# Patient Record
Sex: Male | Born: 1980 | Race: White | Hispanic: No | Marital: Single | Smoking: Never smoker
Health system: Southern US, Community
[De-identification: ages and names within clinical notes are randomized; demographics above are authoritative.]

## PROBLEM LIST (undated history)

## (undated) DIAGNOSIS — J45909 Unspecified asthma, uncomplicated: Secondary | ICD-10-CM

---

## 2013-11-15 ENCOUNTER — Encounter (HOSPITAL_COMMUNITY): Payer: Self-pay | Admitting: Emergency Medicine

## 2013-11-15 ENCOUNTER — Emergency Department (HOSPITAL_COMMUNITY): Payer: Self-pay

## 2013-11-15 ENCOUNTER — Emergency Department (HOSPITAL_COMMUNITY)
Admission: EM | Admit: 2013-11-15 | Discharge: 2013-11-15 | Disposition: A | Discharge status: Home / Self Care | Payer: Self-pay | Attending: Emergency Medicine | Admitting: Emergency Medicine

## 2013-11-15 DIAGNOSIS — S52599A Other fractures of lower end of unspecified radius, initial encounter for closed fracture: Secondary | ICD-10-CM | POA: Insufficient documentation

## 2013-11-15 DIAGNOSIS — Y9301 Activity, walking, marching and hiking: Secondary | ICD-10-CM | POA: Insufficient documentation

## 2013-11-15 DIAGNOSIS — S59919A Unspecified injury of unspecified forearm, initial encounter: Secondary | ICD-10-CM

## 2013-11-15 DIAGNOSIS — S6990XA Unspecified injury of unspecified wrist, hand and finger(s), initial encounter: Secondary | ICD-10-CM

## 2013-11-15 DIAGNOSIS — Y92009 Unspecified place in unspecified non-institutional (private) residence as the place of occurrence of the external cause: Secondary | ICD-10-CM | POA: Insufficient documentation

## 2013-11-15 DIAGNOSIS — S62102A Fracture of unspecified carpal bone, left wrist, initial encounter for closed fracture: Secondary | ICD-10-CM

## 2013-11-15 DIAGNOSIS — J45909 Unspecified asthma, uncomplicated: Secondary | ICD-10-CM | POA: Insufficient documentation

## 2013-11-15 DIAGNOSIS — S59909A Unspecified injury of unspecified elbow, initial encounter: Secondary | ICD-10-CM | POA: Insufficient documentation

## 2013-11-15 DIAGNOSIS — R296 Repeated falls: Secondary | ICD-10-CM | POA: Insufficient documentation

## 2013-11-15 HISTORY — DX: Unspecified asthma, uncomplicated: J45.909

## 2013-11-15 MED ORDER — NAPROXEN 500 MG PO TABS: 500.0000 mg | ORAL_TABLET | Freq: Two times a day (BID) | ORAL | Status: DC

## 2013-11-15 MED ORDER — OXYCODONE-ACETAMINOPHEN 5-325 MG PO TABS
1.0000 | ORAL_TABLET | Freq: Once | ORAL | Status: AC
  Administered 2013-11-15: 1 via ORAL
  Filled 2013-11-15: qty 1

## 2013-11-15 MED ORDER — OXYCODONE-ACETAMINOPHEN 5-325 MG PO TABS: 1.0000 | ORAL_TABLET | ORAL | Status: DC | PRN

## 2013-11-15 NOTE — ED Provider Notes (Signed)
CSN: [id number]     Arrival date & time 11/15/13  1250 History   First MD Initiated Contact with Patient 11/15/13 1307     Chief Complaint  Patient presents with  . Wrist Pain     (Consider location/radiation/quality/duration/timing/severity/associated sxs/prior Treatment) Patient is a 33 y.o. male presenting with wrist pain. The history is provided by the patient.  Wrist Pain This is a new problem. The current episode started today. The problem occurs constantly.   Andrew Contreras is a 33 y.o. male who presents to the ED with left wrist pain. He was walking at home in a wooded area and fell. He landed on his hands. He is having pain and swelling to the left wrist. He denies other injuries. He has taken nothing for pain.   Past Medical History  Diagnosis Date  . Asthma    History reviewed. No pertinent past surgical history. Family History  Problem Relation Age of Onset  . Cancer Father   . Diabetes Father    History  Substance Use Topics  . Smoking status: Never Smoker   . Smokeless tobacco: Not on file  . Alcohol Use: No    Review of Systems  Musculoskeletal:       Left wrist pain and swelling  all other systems negative    Allergies  Review of patient's allergies indicates no known allergies.  Home Medications   Prior to Admission medications   Not on File   BP 140/83  Pulse 86  Temp(Src) 99 F (37.2 C) (Oral)  Resp 18  Ht 6\' 2"  (1.88 m)  Wt 250 lb (113.399 kg)  BMI 32.08 kg/m2  SpO2 98% Physical Exam  Nursing note and vitals reviewed. Constitutional: He is oriented to person, place, and time. He appears well-developed and well-nourished.  HENT:  Head: Normocephalic and atraumatic.  Eyes: EOM are normal.  Neck: Neck supple.  Cardiovascular: Normal rate.   Pulmonary/Chest: Effort normal.  Musculoskeletal:       Left wrist: He exhibits tenderness, swelling and deformity. He exhibits normal range of motion and no laceration.  Radial pulse strong,  adequate circulation, good touch sensation, good strength.   Neurological: He is alert and oriented to person, place, and time. No cranial nerve deficit.  Skin: Skin is warm and dry.  Psychiatric: He has a normal mood and affect. His behavior is normal.    ED Course  Procedures X-ray, volar splint, pain management, ice, elevation  MDM  Dg Wrist Complete Left  11/15/2013   CLINICAL DATA:  [place]arey Seat, LEFT wrist pain and swelling.  EXAM: LEFT WRIST - COMPLETE 3+ VIEW  COMPARISON:  None.  FINDINGS: There is a transverse, slightly comminuted Colles fracture the distal radius with dorsal angulation. Ulnar styloid grossly intact. No carpal bone fracture. Mild soft tissue swelling.  IMPRESSION: Colles fracture distal radius.   Electronically Signed   By: Davonna Belling M.D.   On: 11/15/2013 13:36    33 y.o. male with left wrist pain and swelling s/p fall outside at his home today. Stable for discharge and remains neurovascularly intact. Discussed with the patient neurovascular checks. He will return for any problems. I have reviewed this patient's vital signs, nurses notes, appropriate labs and imaging.  I have discussed findings with the patient and plan of care. He voices understanding and agrees with plan. He will call Dr. Mort Sawyers office to schedule a follow up appointment.    Medication List         naproxen 500  MG tablet  Commonly known as:  NAPROSYN  Take 1 tablet (500 mg total) by mouth 2 (two) times daily.     oxyCODONE-acetaminophen 5-325 MG per tablet  Commonly known as:  ROXICET  Take 1 tablet by mouth every 4 (four) hours as needed.           [address]Hope ChesapeakeM Neese, [place] 11/16/13 [phone]

## 2013-11-15 NOTE — ED Notes (Signed)
Fall at home in wooded area.  Injury to left wrist.  Pain rated 3.

## 2013-11-15 NOTE — Discharge Instructions (Signed)
Call Dr. Mort SawyersHarrison's office for follow up. Return here as needed for any problems.

## 2013-11-16 NOTE — ED Provider Notes (Signed)
Medical screening examination/treatment/procedure(s) were performed by non-physician practitioner and as supervising physician I was immediately available for consultation/collaboration.   EKG Interpretation None        Abdirizak Richison W Shanik [place], MD 11/16/13 1019 

## 2013-11-18 ENCOUNTER — Encounter: Payer: Self-pay | Admitting: Orthopedic Surgery

## 2013-11-18 ENCOUNTER — Ambulatory Visit (INDEPENDENT_AMBULATORY_CARE_PROVIDER_SITE_OTHER): Payer: Self-pay | Admitting: Orthopedic Surgery

## 2013-11-18 VITALS — BP 138/81 | Ht 74.0 in | Wt 250.0 lb

## 2013-11-18 DIAGNOSIS — S52532A Colles' fracture of left radius, initial encounter for closed fracture: Secondary | ICD-10-CM

## 2013-11-18 DIAGNOSIS — S52539A Colles' fracture of unspecified radius, initial encounter for closed fracture: Secondary | ICD-10-CM

## 2013-11-18 MED ORDER — HYDROCODONE-ACETAMINOPHEN 7.5-325 MG PO TABS: 1.0000 | ORAL_TABLET | ORAL | Status: DC | PRN

## 2013-11-18 NOTE — Patient Instructions (Signed)
Work note- Okay to return to work in [employer]rap[employer], [employer]

## 2013-11-18 NOTE — Progress Notes (Signed)
Chief Complaint  Patient presents with  . Wrist Injury    Left wrist fracture,  DOI 11/15/13    HISTORY: 33 year old male right-hand-dominant injured on 11/15/2013 felt complains of left gall wrist pain which is currently 2/10. He got a Percocet in the ER didn't take it takes naproxen 500  Review of systems has been recorded reviewed and signed and scanned into the chart  The past, family history and social history have been reviewed and are recorded in the corresponding sections of epic    Vital signs are stable as recorded  General appearance is normal, body habitus large  The patient is alert and oriented x 3  The patient's mood and affect are normal  Gait assessment: Normal  The cardiovascular exam reveals normal pulses and temperature without edema or  swelling.  The lymphatic system is negative for palpable lymph nodes  The sensory exam is normal.  There are no pathologic reflexes.  Balance is normal.   Exam of the left wrist  Inspection tender and swollen distal radius Range of motion Limited range of motion Stability could not assess stability x-ray shows stable joint Strength muscle tone normal Skin normal, no rash, or laceration. Provocative tests and unnecessary  A/P X-rays show a nondisplaced slightly dorsally angulated apex volar distal radius fracture with normal radial inclination  Distal radius fracture left wrist closed initial treatment  Short arm cast for 4 weeks x-ray in cast in 4 weeks  Meds ordered this encounter  Medications  . HYDROcodone-acetaminophen (NORCO) 7.5-325 MG per tablet    Sig: Take 1 tablet by mouth every 4 (four) hours as needed for moderate pain.    Dispense:  84 tablet    Refill:  0

## 2013-12-17 ENCOUNTER — Encounter: Payer: Self-pay | Admitting: Orthopedic Surgery

## 2013-12-17 ENCOUNTER — Ambulatory Visit (INDEPENDENT_AMBULATORY_CARE_PROVIDER_SITE_OTHER): Payer: MEDICARE

## 2013-12-17 ENCOUNTER — Ambulatory Visit (INDEPENDENT_AMBULATORY_CARE_PROVIDER_SITE_OTHER): Payer: MEDICARE | Admitting: Orthopedic Surgery

## 2013-12-17 VITALS — BP 121/65 | Ht 74.0 in | Wt 250.0 lb

## 2013-12-17 DIAGNOSIS — IMO0001 Reserved for inherently not codable concepts without codable children: Secondary | ICD-10-CM

## 2013-12-17 DIAGNOSIS — S62102D Fracture of unspecified carpal bone, left wrist, subsequent encounter for fracture with routine healing: Secondary | ICD-10-CM

## 2013-12-17 NOTE — Progress Notes (Signed)
Chief Complaint  Patient presents with  . Follow-up    follow up left wrist fx with xray, DOI 11/15/13   BP 121/65  Ht  (1.88 m)  Wt 250 lb (113.399 kg)  BMI 32.08 kg/m2   HISTORY: 33 year old male right-hand-dominant injured on 11/15/2013  Short arm cast;  x-ray in cast in 4 weeks    Meds ordered this encounter   Medications   .  HYDROcodone-acetaminophen (NORCO) 7.5-325 MG per tablet       Sig: Take 1 tablet by mouth every 4 (four) hours as needed for moderate pain.       Dispense:  84 tablet       Refill:  0    Wrist Injury        Left wrist fracture,  DOI 11/15/13    X-ray show stable fracture  Patient placed in heat multiple fracture brace come back 4 weeks x-ray again

## 2013-12-17 NOTE — Patient Instructions (Signed)
WEAR BRACE AT ALL TIMES EXCEPT FOR SHOWERING

## 2014-01-14 ENCOUNTER — Ambulatory Visit (INDEPENDENT_AMBULATORY_CARE_PROVIDER_SITE_OTHER): Payer: MEDICARE

## 2014-01-14 ENCOUNTER — Ambulatory Visit (INDEPENDENT_AMBULATORY_CARE_PROVIDER_SITE_OTHER): Payer: Self-pay | Admitting: Orthopedic Surgery

## 2014-01-14 ENCOUNTER — Encounter: Payer: Self-pay | Admitting: Orthopedic Surgery

## 2014-01-14 VITALS — BP 128/58 | Ht 74.0 in | Wt 250.0 lb

## 2014-01-14 DIAGNOSIS — S62102D Fracture of unspecified carpal bone, left wrist, subsequent encounter for fracture with routine healing: Secondary | ICD-10-CM

## 2014-01-14 NOTE — Progress Notes (Signed)
Chief Complaint  Patient presents with  . Follow-up    4 week recheck on left wrist fracture with xray. DOI 11-15-13.    Approximately 8 weeks status post left distal radius fracture treated with cast followed by hard brace doing well without symptoms. X-rays show fracture healing with slight apex volar angulation. He has normal range of motion in his wrist except for supination which is about 80% of normal. No tenderness at his fracture site is neurovascularly intact  He is released from care.

## 2014-12-24 ENCOUNTER — Ambulatory Visit (INDEPENDENT_AMBULATORY_CARE_PROVIDER_SITE_OTHER): Payer: Commercial Managed Care - HMO | Admitting: Family Medicine

## 2014-12-24 ENCOUNTER — Encounter: Payer: Self-pay | Admitting: Family Medicine

## 2014-12-24 VITALS — BP 114/71 | HR 68 | Temp 98.2°F | Ht 71.0 in | Wt 243.6 lb

## 2014-12-24 DIAGNOSIS — Z0000 Encounter for general adult medical examination without abnormal findings: Secondary | ICD-10-CM | POA: Diagnosis not present

## 2014-12-24 LAB — POCT UA - MICROSCOPIC ONLY
BACTERIA, U MICROSCOPIC: NEGATIVE
CRYSTALS, UR, HPF, POC: NEGATIVE
Casts, Ur, LPF, POC: NEGATIVE
Mucus, UA: NEGATIVE
RBC, URINE, MICROSCOPIC: NEGATIVE
WBC, Ur, HPF, POC: NEGATIVE
Yeast, UA: NEGATIVE

## 2014-12-24 LAB — POCT URINALYSIS DIPSTICK
BILIRUBIN UA: NEGATIVE
Blood, UA: NEGATIVE
GLUCOSE UA: NEGATIVE
Ketones, UA: NEGATIVE
LEUKOCYTES UA: NEGATIVE
NITRITE UA: NEGATIVE
PH UA: 8
Protein, UA: NEGATIVE
Spec Grav, UA: 1.01
Urobilinogen, UA: NEGATIVE

## 2014-12-24 NOTE — Patient Instructions (Signed)
Exercise to Lose Weight Exercise and a healthy diet may help you lose weight. Your doctor may suggest specific exercises. EXERCISE IDEAS AND TIPS  Choose low-cost things you enjoy doing, such as walking, bicycling, or exercising to workout videos.  Take stairs instead of the elevator.  Walk during your lunch break.  Park your car further away from work or school.  Go to a gym or an exercise class.  Start with 5 to 10 minutes of exercise each day. Build up to 30 minutes of exercise 4 to 6 days a week.  Wear shoes with good support and comfortable clothes.  Stretch before and after working out.  Work out until you breathe harder and your heart beats faster.  Drink extra water when you exercise.  Do not do so much that you hurt yourself, feel dizzy, or get very short of breath. Exercises that burn about 150 calories:  Running 1  miles in 15 minutes.  Playing volleyball for 45 to 60 minutes.  Washing and waxing a car for 45 to 60 minutes.  Playing touch football for 45 minutes.  Walking 1  miles in 35 minutes.  Pushing a stroller 1  miles in 30 minutes.  Playing basketball for 30 minutes.  Raking leaves for 30 minutes.  Bicycling 5 miles in 30 minutes.  Walking 2 miles in 30 minutes.  Dancing for 30 minutes.  Shoveling snow for 15 minutes.  Swimming laps for 20 minutes.  Walking up stairs for 15 minutes.  Bicycling 4 miles in 15 minutes.  Gardening for 30 to 45 minutes.  Jumping rope for 15 minutes.  Washing windows or floors for 45 to 60 minutes. Document Released: 04/30/2010 Document Revised: 06/20/2011 Document Reviewed: 04/30/2010 Exit[employer] Patient Information 2015 [employer]. This information is not intended to replace advice given to you by your health care provider. Make sure you discuss any questions you have with your health care provider.  

## 2014-12-24 NOTE — Progress Notes (Signed)
Subjective:  Patient ID: Andrew Contreras, male    DOB: Feb 09, 1981  Age: 34 y.o. MRN: [id number]  CC: Annual Exam   HPI Andrew Contreras presents for CPE  History Andrew Contreras has a past medical history of Asthma.   He has no past surgical history on file.   His family history includes Cancer in his father; Diabetes in his father.He reports that he has never smoked. He does not have any smokeless tobacco history on file. He reports that he does not drink alcohol or use illicit drugs.  Outpatient Prescriptions Prior to Visit  Medication Sig Dispense Refill  . HYDROcodone-acetaminophen (NORCO) 7.5-325 MG per tablet Take 1 tablet by mouth every 4 (four) hours as needed for moderate pain. (Patient not taking: Reported on 12/24/2014) 84 tablet 0  . naproxen (NAPROSYN) 500 MG tablet Take 1 tablet (500 mg total) by mouth 2 (two) times daily. (Patient not taking: Reported on 12/24/2014) 20 tablet 0  . oxyCODONE-acetaminophen (ROXICET) 5-325 MG per tablet Take 1 tablet by mouth every 4 (four) hours as needed. (Patient not taking: Reported on 12/24/2014) 20 tablet 0   No facility-administered medications prior to visit.    ROS Review of Systems  Constitutional: Negative for fever, chills, diaphoresis, activity change, appetite change, fatigue and unexpected weight change.  HENT: Negative for congestion, ear pain, hearing loss, postnasal drip, rhinorrhea, sore throat, tinnitus and trouble swallowing.   Eyes: Negative for photophobia, pain, discharge and redness.  Respiratory: Negative for apnea, cough, choking, chest tightness, shortness of breath, wheezing and stridor.   Cardiovascular: Negative for chest pain, palpitations and leg swelling.  Gastrointestinal: Negative for nausea, vomiting, abdominal pain, diarrhea, constipation, blood in stool and abdominal distention.  Endocrine: Negative for cold intolerance, heat intolerance, polydipsia, polyphagia and polyuria.  Genitourinary: Negative for  dysuria, urgency, frequency, hematuria, flank pain, enuresis, difficulty urinating and genital sores.  Musculoskeletal: Negative for joint swelling and arthralgias.  Skin: Negative for color change, rash and wound.  Allergic/Immunologic: Negative for immunocompromised state.  Neurological: Negative for dizziness, tremors, seizures, syncope, facial asymmetry, speech difficulty, weakness, light-headedness, numbness and headaches.  Hematological: Does not bruise/bleed easily.  Psychiatric/Behavioral: Negative for suicidal ideas, hallucinations, behavioral problems, confusion, sleep disturbance, dysphoric mood, decreased concentration and agitation. The patient is not nervous/anxious and is not hyperactive.     Objective:  BP 114/71 mmHg  Pulse 68  Temp(Src) 98.2 F (36.8 C) (Oral)  Ht 5' 11"  (1.803 m)  Wt 243 lb 9.6 oz (110.496 kg)  BMI 33.99 kg/m2  BP Readings from Last 3 Encounters:  12/24/14 114/71  01/14/14 128/58  12/17/13 121/65    Wt Readings from Last 3 Encounters:  12/24/14 243 lb 9.6 oz (110.496 kg)  01/14/14 250 lb (113.399 kg)  12/17/13 250 lb (113.399 kg)     Physical Exam  Constitutional: He is oriented to person, place, and time. He appears well-developed and well-nourished.  HENT:  Head: Normocephalic and atraumatic.  Mouth/Throat: Oropharynx is clear and moist.  Eyes: EOM are normal. Pupils are equal, round, and reactive to light.  Neck: Normal range of motion. No tracheal deviation present. No thyromegaly present.  Cardiovascular: Normal rate, regular rhythm and normal heart sounds.  Exam reveals no gallop and no friction rub.   No murmur heard. Pulmonary/Chest: Breath sounds normal. He has no wheezes. He has no rales.  Abdominal: Soft. He exhibits no mass. There is no tenderness.  Musculoskeletal: Normal range of motion. He exhibits no edema.  Neurological: He is  alert and oriented to person, place, and time.  Skin: Skin is warm and dry.  Psychiatric: He  has a normal mood and affect.    No results found for: HGBA1C  No results found for: WBC, HGB, HCT, PLT, GLUCOSE, CHOL, TRIG, HDL, LDLDIRECT, LDLCALC, ALT, AST, NA, K, CL, CREATININE, BUN, CO2, TSH, PSA, INR, GLUF, HGBA1C, MICROALBUR  Dg Wrist Complete Left  11/15/2013   CLINICAL DATA:  [place], LEFT wrist pain and swelling.  EXAM: LEFT WRIST - COMPLETE 3+ VIEW  COMPARISON:  None.  FINDINGS: There is a transverse, slightly comminuted Colles fracture the distal radius with dorsal angulation. Ulnar styloid grossly intact. No carpal bone fracture. Mild soft tissue swelling.  IMPRESSION: Colles fracture distal radius.   Electronically Signed   By: Rolla Flatten M.D.   On: 11/15/2013 13:36    Assessment & Plan:   Martha was seen today for annual exam.  Diagnoses and all orders for this visit:  Wellness examination -     POCT urinalysis dipstick -     POCT UA - Microscopic Only -     CMP14+EGFR -     CBC with Differential/Platelet -     Lipid panel -     TSH   I have discontinued Mr. Wolgamott oxyCODONE-acetaminophen, naproxen, and HYDROcodone-acetaminophen.  No orders of the defined types were placed in this encounter.     Follow-up: Return in about 1 year (around 12/24/2015) for CPE.  Claretta Fraise, M.D.

## 2014-12-25 LAB — CMP14+EGFR
A/G RATIO: 2.1 (ref 1.1–2.5)
ALT: 38 IU/L (ref 0–44)
AST: 23 IU/L (ref 0–40)
Albumin: 5 g/dL (ref 3.5–5.5)
Alkaline Phosphatase: 49 IU/L (ref 39–117)
BILIRUBIN TOTAL: 0.9 mg/dL (ref 0.0–1.2)
BUN/Creatinine Ratio: 14 (ref 8–19)
BUN: 12 mg/dL (ref 6–20)
CHLORIDE: 99 mmol/L (ref 97–108)
CO2: 26 mmol/L (ref 18–29)
Calcium: 9.7 mg/dL (ref 8.7–10.2)
Creatinine, Ser: 0.83 mg/dL (ref 0.76–1.27)
GFR calc Af Amer: 136 mL/min/{1.73_m2} (ref >59)
GFR calc non Af Amer: 117 mL/min/{1.73_m2} (ref >59)
Globulin, Total: 2.4 g/dL (ref 1.5–4.5)
Glucose: 99 mg/dL (ref 65–99)
POTASSIUM: 5.2 mmol/L (ref 3.5–5.2)
Sodium: 142 mmol/L (ref 134–144)
Total Protein: 7.4 g/dL (ref 6.0–8.5)

## 2014-12-25 LAB — TSH: TSH: 0.744 u[IU]/mL (ref 0.450–4.500)

## 2014-12-25 LAB — CBC WITH DIFFERENTIAL/PLATELET
BASOS ABS: 0 10*3/uL (ref 0.0–0.2)
Basos: 0 %
EOS (ABSOLUTE): 0.1 10*3/uL (ref 0.0–0.4)
Eos: 2 %
Hematocrit: 48.3 % (ref 37.5–51.0)
Hemoglobin: 16.5 g/dL (ref 12.6–17.7)
IMMATURE GRANS (ABS): 0 10*3/uL (ref 0.0–0.1)
IMMATURE GRANULOCYTES: 0 %
Lymphocytes Absolute: 1.9 10*3/uL (ref 0.7–3.1)
Lymphs: 27 %
MCH: 30.6 pg (ref 26.6–33.0)
MCHC: 34.2 g/dL (ref 31.5–35.7)
MCV: 90 fL (ref 79–97)
MONOS ABS: 0.6 10*3/uL (ref 0.1–0.9)
Monocytes: 8 %
NEUTROS ABS: 4.3 10*3/uL (ref 1.4–7.0)
NEUTROS PCT: 63 %
Platelets: 201 10*3/uL (ref 150–379)
RBC: 5.39 x10E6/uL (ref 4.14–5.80)
RDW: 13.3 % (ref 12.3–15.4)
WBC: 6.9 10*3/uL (ref 3.4–10.8)

## 2014-12-25 LAB — LIPID PANEL
CHOLESTEROL TOTAL: 212 mg/dL — AB (ref 100–199)
Chol/HDL Ratio: 5.6 ratio units — ABNORMAL HIGH (ref 0.0–5.0)
HDL: 38 mg/dL — ABNORMAL LOW (ref >39)
LDL Calculated: 125 mg/dL — ABNORMAL HIGH (ref 0–99)
Triglycerides: 246 mg/dL — ABNORMAL HIGH (ref 0–149)
VLDL CHOLESTEROL CAL: 49 mg/dL — AB (ref 5–40)

## 2014-12-29 ENCOUNTER — Telehealth: Payer: Self-pay | Admitting: Family Medicine

## 2014-12-29 NOTE — Progress Notes (Signed)
PT. Mother aware

## 2014-12-29 NOTE — Telephone Encounter (Signed)
He doesn't need to see a provider but he does need to check in at the front desk for a lab appointment.  Mother notified.

## 2015-03-19 ENCOUNTER — Ambulatory Visit (INDEPENDENT_AMBULATORY_CARE_PROVIDER_SITE_OTHER): Payer: Commercial Managed Care - HMO | Admitting: Family Medicine

## 2015-03-19 ENCOUNTER — Encounter: Payer: Self-pay | Admitting: Family Medicine

## 2015-03-19 VITALS — BP 132/81 | HR 79 | Temp 98.8°F | Ht 71.0 in | Wt 235.2 lb

## 2015-03-19 DIAGNOSIS — J020 Streptococcal pharyngitis: Secondary | ICD-10-CM

## 2015-03-19 DIAGNOSIS — J029 Acute pharyngitis, unspecified: Secondary | ICD-10-CM | POA: Diagnosis not present

## 2015-03-19 LAB — POCT RAPID STREP A (OFFICE): Rapid Strep A Screen: POSITIVE — AB

## 2015-03-19 MED ORDER — AMOXICILLIN 500 MG PO CAPS: 500.0000 mg | ORAL_CAPSULE | Freq: Two times a day (BID) | ORAL | Status: DC

## 2015-03-19 NOTE — Progress Notes (Signed)
   HPI  Patient presents today with sore throat  He reports 3 days of sore throat and tender lymph nodes bilaterally in his neck. He has no dyspnea, cough, facial pain, oral intolerance, or headaches.   He has no sick contacts.   He is eating easily but avoiding crunchy or hard foods. He is drinking fluids easily   PMH: Smoking status noted ROS: Per HPI  Objective: BP 132/81 mmHg  Pulse 79  Temp(Src) 98.8 F (37.1 C) (Oral)  Ht 5\' 11"  (1.803 m)  Wt 235 lb 3.2 oz (106.686 kg)  BMI 32.82 kg/m2 Gen: NAD, alert, cooperative with exam HEENT: NCAT, erythema of the oropharynx, swollen tonsils and white exudates Neck: tender cervical LNs BL CV: RRR, good S1/S2, no murmur Resp: CTABL, no wheezes, non-labored Ext: No edema, warm Neuro: Alert and oriented, No gross deficits  Assessment and plan:  # Strep throat Tx with amoxicillin Chlorasceptic spray, tylenol, ibuprofen, discussed supportive care.  F/u as needed, RTC if worsening or fails to improve.  Orders Placed This Encounter  Procedures  . Culture, Group A Strep  . POCT rapid strep A    Meds ordered this encounter  Medications  . amoxicillin (AMOXIL) 500 MG capsule    Sig: Take 1 capsule (500 mg total) by mouth 2 (two) times daily.    Dispense:  20 capsule    Refill:  0    Murtis SinkSam Neils [place], MD [facility] 03/19/2015, 12:44 PM

## 2015-03-19 NOTE — Patient Instructions (Signed)
Great to meet you!  Be sure to finish all of the antibiotics.   Strep Throat Strep throat is a bacterial infection of the throat. Your health care provider may call the infection tonsillitis or pharyngitis, depending on whether there is swelling in the tonsils or at the back of the throat. Strep throat is most common during the cold months of the year in children who are 365-32 years of age, but it can happen during any season in people of any age. This infection is spread from person to person (contagious) through coughing, sneezing, or close contact. CAUSES Strep throat is caused by the bacteria called Streptococcus pyogenes. RISK FACTORS This condition is more likely to develop in:  People who spend time in crowded places where the infection can spread easily.  People who have close contact with someone who has strep throat. SYMPTOMS Symptoms of this condition include:  Fever or chills.   Redness, swelling, or pain in the tonsils or throat.  Pain or difficulty when swallowing.  White or yellow spots on the tonsils or throat.  Swollen, tender glands in the neck or under the jaw.  Red rash all over the body (rare). DIAGNOSIS This condition is diagnosed by performing a rapid strep test or by taking a swab of your throat (throat culture test). Results from a rapid strep test are usually ready in a few minutes, but throat culture test results are available after one or two days. TREATMENT This condition is treated with antibiotic medicine. HOME CARE INSTRUCTIONS Medicines  Take over-the-counter and prescription medicines only as told by your health care provider.  Take your antibiotic as told by your health care provider. Do not stop taking the antibiotic even if you start to feel better.  Have family members who also have a sore throat or fever tested for strep throat. They may need antibiotics if they have the strep infection. Eating and Drinking  Do not share food, drinking  cups, or personal items that could cause the infection to spread to other people.  If swallowing is difficult, try eating soft foods until your sore throat feels better.  Drink enough fluid to keep your urine clear or pale yellow. General Instructions  Gargle with a salt-water mixture 3-4 times per day or as needed. To make a salt-water mixture, completely dissolve -1 tsp of salt in 1 cup of warm water.  Make sure that all household members wash their hands well.  Get plenty of rest.  Stay home from school or work until you have been taking antibiotics for 24 hours.  Keep all follow-up visits as told by your health care provider. This is important. SEEK MEDICAL CARE IF:  The glands in your neck continue to get bigger.  You develop a rash, cough, or earache.  You cough up a thick liquid that is green, yellow-brown, or bloody.  You have pain or discomfort that does not get better with medicine.  Your problems seem to be getting worse rather than better.  You have a fever. SEEK IMMEDIATE MEDICAL CARE IF:  You have new symptoms, such as vomiting, severe headache, stiff or painful neck, chest pain, or shortness of breath.  You have severe throat pain, drooling, or changes in your voice.  You have swelling of the neck, or the skin on the neck becomes red and tender.  You have signs of dehydration, such as fatigue, dry mouth, and decreased urination.  You become increasingly sleepy, or you cannot wake up completely.  Your joints become red or painful.   This information is not intended to replace advice given to you by your health care provider. Make sure you discuss any questions you have with your health care provider.   Document Released: 03/25/2000 Document Revised: 12/17/2014 Document Reviewed: 07/21/2014 Elsevier Interactive Patient Education [employer].

## 2015-03-22 LAB — CULTURE, GROUP A STREP

## 2015-10-23 IMAGING — CR DG WRIST COMPLETE 3+V*L*
2 series · 2 of 2 positions shown · non-contrast
Comparison: None.

CLINICAL DATA: Fell, LEFT wrist pain and swelling.

EXAM:
LEFT WRIST - COMPLETE 3+ VIEW

[view not recorded (1 of 2)]
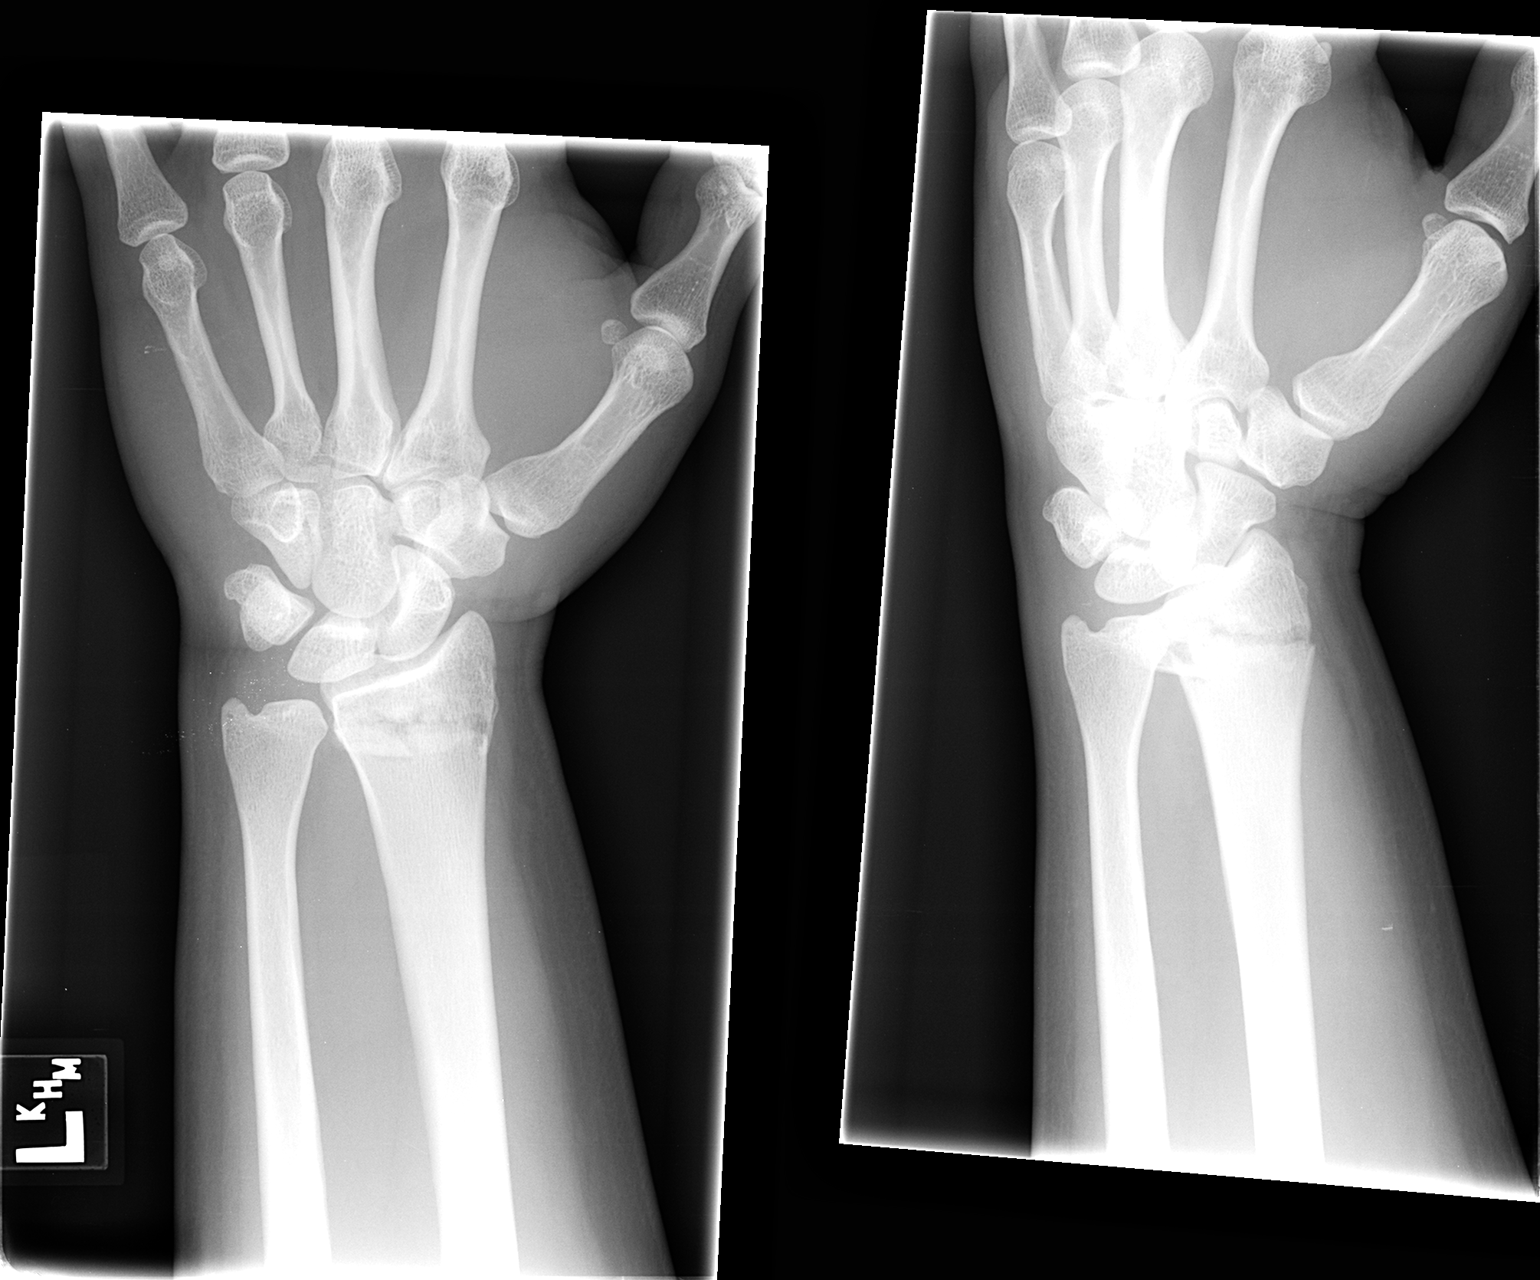

[view not recorded (2 of 2)]
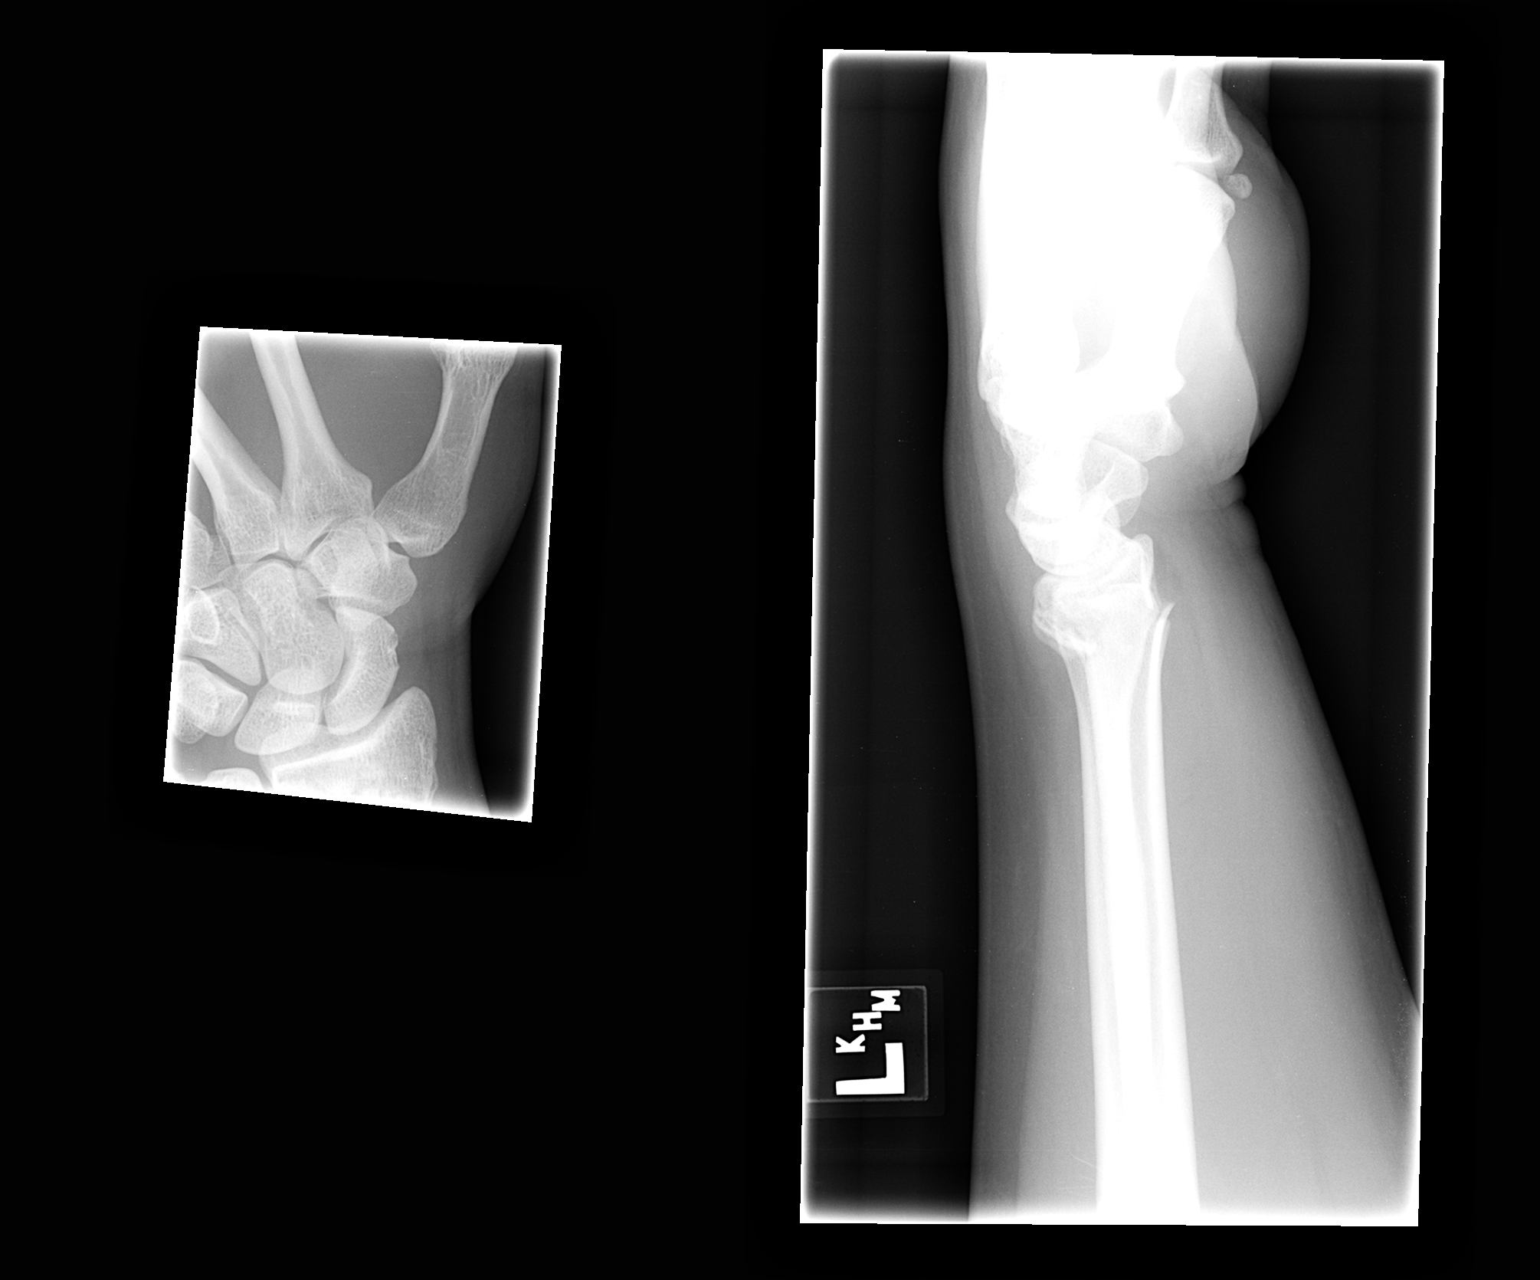

[2 of 2 positions shown; findings below may reference images not displayed]

FINDINGS: There is a transverse, slightly comminuted Colles fracture the
distal radius with dorsal angulation. Ulnar styloid grossly intact.
No carpal bone fracture. Mild soft tissue swelling.
IMPRESSION: Colles fracture distal radius.

## 2018-05-10 ENCOUNTER — Encounter: Payer: Self-pay | Admitting: Physician Assistant

## 2018-05-10 ENCOUNTER — Ambulatory Visit: Payer: Commercial Managed Care - HMO | Admitting: Physician Assistant

## 2018-05-10 VITALS — BP 117/74 | HR 100 | Temp 103.0°F | Ht 71.0 in | Wt 255.2 lb

## 2018-05-10 DIAGNOSIS — J101 Influenza due to other identified influenza virus with other respiratory manifestations: Secondary | ICD-10-CM | POA: Diagnosis not present

## 2018-05-10 DIAGNOSIS — R509 Fever, unspecified: Secondary | ICD-10-CM | POA: Diagnosis not present

## 2018-05-10 LAB — VERITOR FLU A/B WAIVED
INFLUENZA A: POSITIVE — AB
Influenza B: NEGATIVE

## 2018-05-10 MED ORDER — OSELTAMIVIR PHOSPHATE 75 MG PO CAPS: 75.0000 mg | ORAL_CAPSULE | Freq: Two times a day (BID) | ORAL | 0 refills | Status: DC

## 2018-05-10 NOTE — Progress Notes (Signed)
BP 117/74   Pulse 100   Temp (!) 103 F (39.4 C) (Oral)   Ht 5\' 11"  (1.803 m)   Wt 255 lb 3.2 oz (115.8 kg)   BMI 35.59 kg/m    Subjective:    Patient ID: Andrew Contreras, male    DOB: 09-11-1980, 38 y.o.   MRN: [id number]  HPI: Andrew Contreras is a 38 y.o. male presenting on 05/10/2018 for Fever; Generalized Body Aches; and New Patient (Initial Visit)    Past Medical History:  Diagnosis Date  . Asthma    Relevant past medical, surgical, family and social history reviewed and updated as indicated. Interim medical history since our last visit reviewed. Allergies and medications reviewed and updated. DATA REVIEWED: CHART IN EPIC  Family History reviewed for pertinent findings.  Review of Systems  Constitutional: Positive for activity change, fatigue and fever. Negative for appetite change.  HENT: Positive for congestion and sore throat. Negative for sinus pressure.   Eyes: Negative.  Negative for pain and visual disturbance.  Respiratory: Negative for cough, chest tightness, shortness of breath and wheezing.   Cardiovascular: Negative.  Negative for chest pain, palpitations and leg swelling.  Gastrointestinal: Positive for nausea. Negative for abdominal pain, diarrhea and vomiting.  Endocrine: Negative.   Genitourinary: Negative.   Musculoskeletal: Positive for back pain and myalgias. Negative for arthralgias.  Skin: Negative.  Negative for color change and rash.  Neurological: Positive for headaches. Negative for weakness and numbness.  Psychiatric/Behavioral: Negative.     Allergies as of 05/10/2018   No Known Allergies     Medication List       Accurate as of May 10, 2018 12:00 PM. Always use your most recent med list.        oseltamivir 75 MG capsule Commonly known as:  TAMIFLU Take 1 capsule (75 mg total) by mouth 2 (two) times daily.          Objective:    BP 117/74   Pulse 100   Temp (!) 103 F (39.4 C) (Oral)   Ht 5\' 11"  (1.803 m)   Wt  255 lb 3.2 oz (115.8 kg)   BMI 35.59 kg/m   No Known Allergies  Wt Readings from Last 3 Encounters:  05/10/18 255 lb 3.2 oz (115.8 kg)  03/19/15 235 lb 3.2 oz (106.7 kg)  12/24/14 243 lb 9.6 oz (110.5 kg)    Physical Exam Vitals signs and nursing note reviewed.  Constitutional:      General: He is in acute distress.     Appearance: He is well-developed.  HENT:     Head: Normocephalic and atraumatic.     Right Ear: Tympanic membrane normal. No drainage. No middle ear effusion.     Left Ear: Tympanic membrane normal. No drainage.  No middle ear effusion.     Nose: Mucosal edema and rhinorrhea present.     Right Sinus: No maxillary sinus tenderness.     Left Sinus: No maxillary sinus tenderness.     Mouth/Throat:     Pharynx: Uvula midline. Posterior oropharyngeal erythema present. No oropharyngeal exudate.  Eyes:     General:        Right eye: No discharge.        Left eye: No discharge.     Conjunctiva/sclera: Conjunctivae normal.     Pupils: Pupils are equal, round, and reactive to light.  Neck:     Musculoskeletal: Normal range of motion.  Cardiovascular:  Rate and Rhythm: Normal rate and regular rhythm.     Heart sounds: Normal heart sounds.  Pulmonary:     Effort: Pulmonary effort is normal. No respiratory distress.     Breath sounds: Normal breath sounds. No wheezing.  Abdominal:     Palpations: Abdomen is soft.  Lymphadenopathy:     Cervical: No cervical adenopathy.  Skin:    General: Skin is warm and dry.  Neurological:     Mental Status: He is alert and oriented to person, place, and time.  Psychiatric:        Behavior: Behavior normal.     Results for orders placed or performed in visit on 03/19/15  Culture, Group A Strep  Result Value Ref Range   Strep A Culture Comment (A)   POCT rapid strep A  Result Value Ref Range   Rapid Strep A Screen Positive (A) Negative      Assessment & Plan:   1. Fever, unspecified fever cause - Veritor Flu A/B  Waived  2. Influenza A - oseltamivir (TAMIFLU) 75 MG capsule; Take 1 capsule (75 mg total) by mouth 2 (two) times daily.  Dispense: 10 capsule; Refill: 0   Continue all other maintenance medications as listed above.  Follow up plan: No follow-ups on file.  Educational handout given for survey  Remus Loffler PA-C [facility] [address]  [place], [place] [address] [phone]   05/10/2018, 12:00 PM

## 2019-02-06 ENCOUNTER — Other Ambulatory Visit: Payer: Self-pay

## 2019-02-06 ENCOUNTER — Encounter: Payer: Self-pay | Admitting: Family Medicine

## 2019-02-06 ENCOUNTER — Ambulatory Visit (INDEPENDENT_AMBULATORY_CARE_PROVIDER_SITE_OTHER): Payer: Managed Care, Other (non HMO) | Admitting: Family Medicine

## 2019-02-06 VITALS — BP 130/80 | HR 68 | Temp 98.2°F | Resp 20 | Ht 71.0 in | Wt 241.0 lb

## 2019-02-06 DIAGNOSIS — Z6833 Body mass index (BMI) 33.0-33.9, adult: Secondary | ICD-10-CM

## 2019-02-06 DIAGNOSIS — Z0000 Encounter for general adult medical examination without abnormal findings: Secondary | ICD-10-CM

## 2019-02-06 DIAGNOSIS — E669 Obesity, unspecified: Secondary | ICD-10-CM | POA: Diagnosis not present

## 2019-02-06 DIAGNOSIS — Z0001 Encounter for general adult medical examination with abnormal findings: Secondary | ICD-10-CM

## 2019-02-06 DIAGNOSIS — Z23 Encounter for immunization: Secondary | ICD-10-CM | POA: Diagnosis not present

## 2019-02-06 DIAGNOSIS — E66811 Obesity, class 1: Secondary | ICD-10-CM

## 2019-02-06 LAB — URINALYSIS
Bilirubin, UA: NEGATIVE
Glucose, UA: NEGATIVE
Ketones, UA: NEGATIVE
Leukocytes,UA: NEGATIVE
Nitrite, UA: NEGATIVE
Protein,UA: NEGATIVE
Specific Gravity, UA: 1.01 (ref 1.005–1.030)
Urobilinogen, Ur: 0.2 mg/dL (ref 0.2–1.0)
pH, UA: 7 (ref 5.0–7.5)

## 2019-02-06 NOTE — Progress Notes (Signed)
Subjective:  Patient ID: Andrew Contreras, male    DOB: Aug 19, 1980  Age: 38 y.o. MRN: [id number]  CC: Annual Exam   HPI Andrew Contreras presents for CPE. Last was several years ago. Was told his cholesterol was high  Depression screen [facility] 2/9 02/06/2019 05/10/2018 12/24/2014  Decreased Interest 0 0 0  Down, Depressed, Hopeless 0 0 0  PHQ - 2 Score 0 0 0    History Andrew Contreras has a past medical history of Asthma.   He has no past surgical history on file.   His family history includes Cancer in his father; Diabetes in his father.He reports that he has never smoked. He has never used smokeless tobacco. He reports that he does not drink alcohol or use drugs.    ROS Review of Systems  Constitutional: Negative for activity change, fatigue and unexpected weight change.  HENT: Negative for congestion, ear pain, hearing loss, postnasal drip and trouble swallowing.   Eyes: Negative for pain and visual disturbance.  Respiratory: Negative for cough, chest tightness and shortness of breath.   Cardiovascular: Negative for chest pain, palpitations and leg swelling.  Gastrointestinal: Negative for abdominal distention, abdominal pain, blood in stool, constipation, diarrhea, nausea and vomiting.  Endocrine: Negative for cold intolerance, heat intolerance and polydipsia.  Genitourinary: Negative for difficulty urinating, dysuria, flank pain, frequency and urgency.  Musculoskeletal: Negative for arthralgias and joint swelling.  Skin: Negative for color change, rash and wound.  Neurological: Negative for dizziness, syncope, speech difficulty, weakness, light-headedness, numbness and headaches.  Hematological: Does not bruise/bleed easily.  Psychiatric/Behavioral: Negative for confusion, decreased concentration, dysphoric mood and sleep disturbance. The patient is not nervous/anxious.     Objective:  BP 130/80   Pulse 68   Temp 98.2 F (36.8 C) (Temporal)   Resp 20   Ht _0  (1.803 m)   Wt  241 lb (109.3 kg)   SpO2 99%   BMI 33.61 kg/m   BP Readings from Last 3 Encounters:  02/06/19 130/80  05/10/18 117/74  03/19/15 132/81    Wt Readings from Last 3 Encounters:  02/06/19 241 lb (109.3 kg)  05/10/18 255 lb 3.2 oz (115.8 kg)  03/19/15 235 lb 3.2 oz (106.7 kg)     Physical Exam Constitutional:      Appearance: He is well-developed.  HENT:     Head: Normocephalic and atraumatic.  Eyes:     Pupils: Pupils are equal, round, and reactive to light.  Neck:     Musculoskeletal: Normal range of motion.     Thyroid: No thyromegaly.     Trachea: No tracheal deviation.  Cardiovascular:     Rate and Rhythm: Normal rate and regular rhythm.     Heart sounds: Normal heart sounds. No murmur. No friction rub. No gallop.   Pulmonary:     Breath sounds: Normal breath sounds. No wheezing or rales.  Abdominal:     General: Bowel sounds are normal. There is no distension.     Palpations: Abdomen is soft. There is no mass.     Tenderness: There is no abdominal tenderness.     Hernia: There is no hernia in the left inguinal area.  Genitourinary:    Penis: Normal.      Scrotum/Testes: Normal.  Musculoskeletal: Normal range of motion.  Lymphadenopathy:     Cervical: No cervical adenopathy.  Skin:    General: Skin is warm and dry.  Neurological:     Mental Status: He is alert and oriented  to person, place, and time.       Assessment & Plan:   Andrew Contreras was seen today for annual exam.  Diagnoses and all orders for this visit:  Well adult exam -     CBC with Differential/Platelet -     CMP14+EGFR -     Lipid panel -     Urinalysis  Need for immunization against influenza -     Flu Vaccine QUAD 36+ mos IM  Class 1 obesity without serious comorbidity with body mass index (BMI) of 33.0 to 33.9 in adult, unspecified obesity type       I have discontinued Andrew Contreras. Andrew Contreras's oseltamivir.  Allergies as of 02/06/2019   No Known Allergies     Medication List        Accurate as of February 06, 2019  8:45 PM. If you have any questions, ask your nurse or doctor.        STOP taking these medications   oseltamivir 75 MG capsule Commonly known as: Tamiflu Stopped by: Claretta Fraise, MD        Follow-up: Return in about 2 years (around 02/05/2021).  Claretta Fraise, M.D.

## 2019-02-07 LAB — CBC WITH DIFFERENTIAL/PLATELET
Basophils Absolute: 0.1 10*3/uL (ref 0.0–0.2)
Basos: 1 %
EOS (ABSOLUTE): 0.1 10*3/uL (ref 0.0–0.4)
Eos: 2 %
Hematocrit: 49.2 % (ref 37.5–51.0)
Hemoglobin: 16.6 g/dL (ref 13.0–17.7)
Immature Grans (Abs): 0 10*3/uL (ref 0.0–0.1)
Immature Granulocytes: 0 %
Lymphocytes Absolute: 1.8 10*3/uL (ref 0.7–3.1)
Lymphs: 24 %
MCH: 29.8 pg (ref 26.6–33.0)
MCHC: 33.7 g/dL (ref 31.5–35.7)
MCV: 88 fL (ref 79–97)
Monocytes Absolute: 0.7 10*3/uL (ref 0.1–0.9)
Monocytes: 10 %
Neutrophils Absolute: 4.8 10*3/uL (ref 1.4–7.0)
Neutrophils: 63 %
Platelets: 234 10*3/uL (ref 150–450)
RBC: 5.57 x10E6/uL (ref 4.14–5.80)
RDW: 12.6 % (ref 11.6–15.4)
WBC: 7.5 10*3/uL (ref 3.4–10.8)

## 2019-02-07 LAB — CMP14+EGFR
ALT: 38 IU/L (ref 0–44)
AST: 26 IU/L (ref 0–40)
Albumin/Globulin Ratio: 2 (ref 1.2–2.2)
Albumin: 5.1 g/dL — ABNORMAL HIGH (ref 4.0–5.0)
Alkaline Phosphatase: 50 IU/L (ref 39–117)
BUN/Creatinine Ratio: 11 (ref 9–20)
BUN: 11 mg/dL (ref 6–20)
Bilirubin Total: 1 mg/dL (ref 0.0–1.2)
CO2: 25 mmol/L (ref 20–29)
Calcium: 10 mg/dL (ref 8.7–10.2)
Chloride: 99 mmol/L (ref 96–106)
Creatinine, Ser: 0.99 mg/dL (ref 0.76–1.27)
GFR calc Af Amer: 113 mL/min/{1.73_m2} (ref >59)
GFR calc non Af Amer: 98 mL/min/{1.73_m2} (ref >59)
Globulin, Total: 2.5 g/dL (ref 1.5–4.5)
Glucose: 81 mg/dL (ref 65–99)
Potassium: 5.1 mmol/L (ref 3.5–5.2)
Sodium: 139 mmol/L (ref 134–144)
Total Protein: 7.6 g/dL (ref 6.0–8.5)

## 2019-02-07 LAB — LIPID PANEL
Chol/HDL Ratio: 6 ratio — ABNORMAL HIGH (ref 0.0–5.0)
Cholesterol, Total: 234 mg/dL — ABNORMAL HIGH (ref 100–199)
HDL: 39 mg/dL — ABNORMAL LOW (ref >39)
LDL Chol Calc (NIH): 156 mg/dL — ABNORMAL HIGH (ref 0–99)
Triglycerides: 210 mg/dL — ABNORMAL HIGH (ref 0–149)
VLDL Cholesterol Cal: 39 mg/dL (ref 5–40)

## 2022-01-04 ENCOUNTER — Encounter: Payer: Self-pay | Admitting: Family Medicine

## 2022-01-04 ENCOUNTER — Ambulatory Visit: Payer: BLUE CROSS/BLUE SHIELD | Admitting: Family Medicine

## 2022-01-04 VITALS — BP 133/76 | HR 86 | Temp 99.0°F | Ht 71.0 in | Wt 248.0 lb

## 2022-01-04 DIAGNOSIS — Z9189 Other specified personal risk factors, not elsewhere classified: Secondary | ICD-10-CM | POA: Diagnosis not present

## 2022-01-04 DIAGNOSIS — R1032 Left lower quadrant pain: Secondary | ICD-10-CM

## 2022-01-04 DIAGNOSIS — Z800 Family history of malignant neoplasm of digestive organs: Secondary | ICD-10-CM | POA: Diagnosis not present

## 2022-01-04 LAB — URINALYSIS
Bilirubin, UA: NEGATIVE
Glucose, UA: NEGATIVE
Ketones, UA: NEGATIVE
Leukocytes,UA: NEGATIVE
Nitrite, UA: NEGATIVE
Protein,UA: NEGATIVE
Specific Gravity, UA: 1.02 (ref 1.005–1.030)
Urobilinogen, Ur: 0.2 mg/dL (ref 0.2–1.0)
pH, UA: 6 (ref 5.0–7.5)

## 2022-01-04 NOTE — Progress Notes (Signed)
Subjective:  Patient ID: Andrew Contreras, male    DOB: Nov 24, 1980, 41 y.o.   MRN: [id number]  Patient Care Team: Claretta Fraise, MD as PCP - General (Family Medicine)   Chief Complaint:  lower left quadrant pain (Burning, pinching x 3 months)   HPI: Andrew Contreras is a 41 y.o. male presenting on 01/04/2022 for lower left quadrant pain (Burning, pinching x 3 months)   Pt presents today for evaluation of ongoing LLQ abdominal pain, worse with heavy lifting and pulling. He is concerned as his father was diagnosed with CRC at age 64. He denies changes in bowel or bladder habits.   Abdominal Pain This is a recurrent problem. Episode onset: 2-3 months ago. The problem occurs intermittently. The problem has been waxing and waning. The pain is located in the LLQ. The pain is moderate. The quality of the pain is burning (pinching). The abdominal pain does not radiate. Pertinent negatives include no anorexia, arthralgias, belching, constipation, diarrhea, dysuria, fever, flatus, frequency, headaches, hematochezia, hematuria, melena, myalgias, nausea, vomiting or weight loss. The pain is aggravated by certain positions and movement (heavy lifting and pulling). The pain is relieved by Nothing. He has tried nothing for the symptoms.    Relevant past medical, surgical, family, and social history reviewed and updated as indicated.  Allergies and medications reviewed and updated. Data reviewed: Chart in Epic.   Past Medical History:  Diagnosis Date   Asthma     History reviewed. No pertinent surgical history.  Social History   Socioeconomic History   Marital status: Single    Spouse name: Not on file   Number of children: Not on file   Years of education: Not on file   Highest education level: Not on file  Occupational History   Not on file  Tobacco Use   Smoking status: Never   Smokeless tobacco: Never  Vaping Use   Vaping Use: Never used  Substance and Sexual Activity   Alcohol  use: No   Drug use: No   Sexual activity: Not on file  Other Topics Concern   Not on file  Social History Narrative   Not on file   Social Determinants of Health   Financial Resource Strain: Not on file  Food Insecurity: Not on file  Transportation Needs: Not on file  Physical Activity: Not on file  Stress: Not on file  Social Connections: Not on file  Intimate Partner Violence: Not on file    No outpatient encounter medications on file as of 01/04/2022.   No facility-administered encounter medications on file as of 01/04/2022.    Allergies  Allergen Reactions   Other Shortness Of Breath    tomatillo's    Review of Systems  Constitutional:  Negative for activity change, appetite change, chills, diaphoresis, fatigue, fever, unexpected weight change and weight loss.  HENT: Negative.    Eyes: Negative.  Negative for photophobia and visual disturbance.  Respiratory:  Negative for cough, chest tightness and shortness of breath.   Cardiovascular:  Negative for chest pain, palpitations and leg swelling.  Gastrointestinal:  Positive for abdominal pain. Negative for abdominal distention, anal bleeding, anorexia, blood in stool, constipation, diarrhea, flatus, hematochezia, melena, nausea, rectal pain and vomiting.  Endocrine: Negative.   Genitourinary:  Negative for decreased urine volume, difficulty urinating, dysuria, enuresis, flank pain, frequency, genital sores, hematuria, penile discharge, penile pain, penile swelling, scrotal swelling, testicular pain and urgency.  Musculoskeletal:  Negative for arthralgias and myalgias.  Skin:  Negative.   Allergic/Immunologic: Negative.   Neurological:  Negative for dizziness, tremors, seizures, syncope, facial asymmetry, speech difficulty, weakness, light-headedness, numbness and headaches.  Hematological: Negative.   Psychiatric/Behavioral:  Negative for confusion, hallucinations, sleep disturbance and suicidal ideas.   All other systems  reviewed and are negative.       Objective:  BP 133/76   Pulse 86   Temp 99 F (37.2 C)   Ht _0  (1.803 m)   Wt 248 lb (112.5 kg)   SpO2 94%   BMI 34.59 kg/m    Wt Readings from Last 3 Encounters:  01/04/22 248 lb (112.5 kg)  02/06/19 241 lb (109.3 kg)  05/10/18 255 lb 3.2 oz (115.8 kg)    Physical Exam Vitals and nursing note reviewed.  Constitutional:      General: He is not in acute distress.    Appearance: Normal appearance. He is well-developed and well-groomed. He is obese. He is not ill-appearing, toxic-appearing or diaphoretic.  HENT:     Head: Normocephalic and atraumatic.     Jaw: There is normal jaw occlusion.     Right Ear: Hearing normal.     Left Ear: Hearing normal.     Nose: Nose normal.     Mouth/Throat:     Lips: Pink.     Mouth: Mucous membranes are moist.     Pharynx: Uvula midline.  Eyes:     General: Lids are normal.     Pupils: Pupils are equal, round, and reactive to light.  Neck:     Thyroid: No thyroid mass, thyromegaly or thyroid tenderness.     Vascular: No carotid bruit or JVD.     Trachea: Trachea and phonation normal.  Cardiovascular:     Rate and Rhythm: Normal rate and regular rhythm.     Chest Wall: PMI is not displaced.     Pulses: Normal pulses.     Heart sounds: Normal heart sounds. No murmur heard.    No friction rub. No gallop.  Pulmonary:     Effort: Pulmonary effort is normal. No respiratory distress.     Breath sounds: Normal breath sounds. No wheezing.  Abdominal:     General: Abdomen is protuberant. Bowel sounds are normal. There is no distension or abdominal bruit.     Palpations: Abdomen is soft. There is no hepatomegaly or splenomegaly.     Tenderness: There is abdominal tenderness in the left lower quadrant. There is no right CVA tenderness, left CVA tenderness, guarding or rebound. Negative signs include Murphy's sign, Rovsing's sign, McBurney's sign, psoas sign and obturator sign.     Hernia: No hernia is  present.    Musculoskeletal:        General: Normal range of motion.     Cervical back: Normal range of motion and neck supple.     Right lower leg: No edema.     Left lower leg: No edema.  Lymphadenopathy:     Cervical: No cervical adenopathy.  Skin:    General: Skin is warm and dry.     Capillary Refill: Capillary refill takes less than 2 seconds.     Coloration: Skin is not cyanotic, jaundiced or pale.     Findings: No rash.  Neurological:     General: No focal deficit present.     Mental Status: He is alert and oriented to person, place, and time.     Sensory: Sensation is intact.     Motor: Motor function is intact.  Coordination: Coordination is intact.     Gait: Gait is intact.     Deep Tendon Reflexes: Reflexes are normal and symmetric.  Psychiatric:        Attention and Perception: Attention and perception normal.        Mood and Affect: Mood and affect normal.        Speech: Speech normal.        Behavior: Behavior normal. Behavior is cooperative.        Thought Content: Thought content normal.        Cognition and Memory: Cognition and memory normal.        Judgment: Judgment normal.     Results for orders placed or performed in visit on 02/06/19  CBC with Differential/Platelet  Result Value Ref Range   WBC 7.5 3.4 - 10.8 x10E3/uL   RBC 5.57 4.14 - 5.80 x10E6/uL   Hemoglobin 16.6 13.0 - 17.7 g/dL   Hematocrit 49.2 37.5 - 51.0 %   MCV 88 79 - 97 fL   MCH 29.8 26.6 - 33.0 pg   MCHC 33.7 31.5 - 35.7 g/dL   RDW 12.6 11.6 - 15.4 %   Platelets 234 150 - 450 x10E3/uL   Neutrophils 63 Not Estab. %   Lymphs 24 Not Estab. %   Monocytes 10 Not Estab. %   Eos 2 Not Estab. %   Basos 1 Not Estab. %   Neutrophils Absolute 4.8 1.4 - 7.0 x10E3/uL   Lymphocytes Absolute 1.8 0.7 - 3.1 x10E3/uL   Monocytes Absolute 0.7 0.1 - 0.9 x10E3/uL   EOS (ABSOLUTE) 0.1 0.0 - 0.4 x10E3/uL   Basophils Absolute 0.1 0.0 - 0.2 x10E3/uL   Immature Granulocytes 0 Not Estab. %    Immature Grans (Abs) 0.0 0.0 - 0.1 x10E3/uL  CMP14+EGFR  Result Value Ref Range   Glucose 81 65 - 99 mg/dL   BUN 11 6 - 20 mg/dL   Creatinine, Ser 0.99 0.76 - 1.27 mg/dL   GFR calc non Af Amer 98 >59 mL/min/1.73   GFR calc Af Amer 113 >59 mL/min/1.73   BUN/Creatinine Ratio 11 9 - 20   Sodium 139 134 - 144 mmol/L   Potassium 5.1 3.5 - 5.2 mmol/L   Chloride 99 96 - 106 mmol/L   CO2 25 20 - 29 mmol/L   Calcium 10.0 8.7 - 10.2 mg/dL   Total Protein 7.6 6.0 - 8.5 g/dL   Albumin 5.1 (H) 4.0 - 5.0 g/dL   Globulin, Total 2.5 1.5 - 4.5 g/dL   Albumin/Globulin Ratio 2.0 1.2 - 2.2   Bilirubin Total 1.0 0.0 - 1.2 mg/dL   Alkaline Phosphatase 50 39 - 117 IU/L   AST 26 0 - 40 IU/L   ALT 38 0 - 44 IU/L  Lipid panel  Result Value Ref Range   Cholesterol, Total 234 (H) 100 - 199 mg/dL   Triglycerides 210 (H) 0 - 149 mg/dL   HDL 39 (L) >39 mg/dL   VLDL Cholesterol Cal 39 5 - 40 mg/dL   LDL Chol Calc (NIH) 156 (H) 0 - 99 mg/dL   Chol/HDL Ratio 6.0 (H) 0.0 - 5.0 ratio  Urinalysis  Result Value Ref Range   Specific Gravity, UA 1.010 1.005 - 1.030   pH, UA 7.0 5.0 - 7.5   Color, UA Yellow Yellow   Appearance Ur Clear Clear   Leukocytes,UA Negative Negative   Protein,UA Negative Negative/Trace   Glucose, UA Negative Negative   Ketones, UA Negative Negative  RBC, UA Trace (A) Negative   Bilirubin, UA Negative Negative   Urobilinogen, Ur 0.2 0.2 - 1.0 mg/dL   Nitrite, UA Negative Negative       Pertinent labs & imaging results that were available during my care of the patient were reviewed by me and considered in my medical decision making.  Assessment & Plan:  Kamal was seen today for lower left quadrant pain.  Diagnoses and all orders for this visit:  LLQ pain Urinalysis with trace blood, unremarkable. Will obtain US of abdomen along with labs. Referral to GI placed as pt is concerned due to [place] history in father. No red flags concerning for malignancy or acute diverticulitis.  -      Urinalysis -     CMP14+EGFR -     CBC with Differential/Platelet -     US Abdomen Limited; Future -     Ambulatory referral to Gastroenterology  Family history of colon cancer in father High risk for colon cancer Referral to GI placed.  -     Ambulatory referral to Gastroenterology     Continue all other maintenance medications.  Follow up plan: Return in about 6 weeks (around 02/15/2022), or if symptoms worsen or fail to improve.   Continue healthy lifestyle choices, including diet (rich in fruits, vegetables, and lean proteins, and low in salt and simple carbohydrates) and exercise (at least 30 minutes of moderate physical activity daily).  Educational handout given for hernia  The above assessment and management plan was discussed with the patient. The patient verbalized understanding of and has agreed to the management plan. Patient is aware to call the clinic if they develop any new symptoms or if symptoms persist or worsen. Patient is aware when to return to the clinic for a follow-up visit. Patient educated on when it is appropriate to go to the emergency department.   Monia Pouch, FNP-C [facility] [phone]

## 2022-01-05 ENCOUNTER — Encounter: Payer: Self-pay | Admitting: *Deleted

## 2022-01-05 LAB — CBC WITH DIFFERENTIAL/PLATELET
Basophils Absolute: 0.1 10*3/uL (ref 0.0–0.2)
Basos: 1 %
EOS (ABSOLUTE): 0.2 10*3/uL (ref 0.0–0.4)
Eos: 3 %
Hematocrit: 48.6 % (ref 37.5–51.0)
Hemoglobin: 16.5 g/dL (ref 13.0–17.7)
Immature Grans (Abs): 0 10*3/uL (ref 0.0–0.1)
Immature Granulocytes: 0 %
Lymphocytes Absolute: 2.2 10*3/uL (ref 0.7–3.1)
Lymphs: 26 %
MCH: 30.3 pg (ref 26.6–33.0)
MCHC: 34 g/dL (ref 31.5–35.7)
MCV: 89 fL (ref 79–97)
Monocytes Absolute: 0.8 10*3/uL (ref 0.1–0.9)
Monocytes: 10 %
Neutrophils Absolute: 5.1 10*3/uL (ref 1.4–7.0)
Neutrophils: 60 %
Platelets: 259 10*3/uL (ref 150–450)
RBC: 5.44 x10E6/uL (ref 4.14–5.80)
RDW: 12.4 % (ref 11.6–15.4)
WBC: 8.4 10*3/uL (ref 3.4–10.8)

## 2022-01-05 LAB — CMP14+EGFR
ALT: 34 IU/L (ref 0–44)
AST: 18 IU/L (ref 0–40)
Albumin/Globulin Ratio: 1.9 (ref 1.2–2.2)
Albumin: 4.8 g/dL (ref 4.1–5.1)
Alkaline Phosphatase: 66 IU/L (ref 44–121)
BUN/Creatinine Ratio: 14 (ref 9–20)
BUN: 12 mg/dL (ref 6–20)
Bilirubin Total: 0.4 mg/dL (ref 0.0–1.2)
CO2: 25 mmol/L (ref 20–29)
Calcium: 9.8 mg/dL (ref 8.7–10.2)
Chloride: 98 mmol/L (ref 96–106)
Creatinine, Ser: 0.85 mg/dL (ref 0.76–1.27)
Globulin, Total: 2.5 g/dL (ref 1.5–4.5)
Glucose: 119 mg/dL — ABNORMAL HIGH (ref 70–99)
Potassium: 4.6 mmol/L (ref 3.5–5.2)
Sodium: 140 mmol/L (ref 134–144)
Total Protein: 7.3 g/dL (ref 6.0–8.5)
eGFR: 114 mL/min/{1.73_m2} (ref >59)

## 2022-01-12 ENCOUNTER — Ambulatory Visit (HOSPITAL_COMMUNITY): Admission: RE | Admit: 2022-01-12 | Payer: BLUE CROSS/BLUE SHIELD | Source: Ambulatory Visit

## 2022-01-19 ENCOUNTER — Ambulatory Visit (HOSPITAL_COMMUNITY)
Admission: RE | Admit: 2022-01-19 | Discharge: 2022-01-19 | Disposition: A | Discharge status: Home / Self Care | Payer: BLUE CROSS/BLUE SHIELD | Source: Ambulatory Visit | Attending: Family Medicine | Admitting: Family Medicine

## 2022-01-19 DIAGNOSIS — R1032 Left lower quadrant pain: Secondary | ICD-10-CM | POA: Insufficient documentation

## 2022-01-23 NOTE — Progress Notes (Unsigned)
  GI Office Note    Referring Provider: Rakes, Linda M, FNP Primary Care Physician:  Stacks, Warren, MD  Primary Gastroenterologist: Charles K. Carver, DO   Chief Complaint   Chief Complaint  Patient presents with   Abdominal Pain    Lower left abdominal pains for about 3 to 4 months. Regular BM's       History of Present Illness   Andrew Contreras is a 41 y.o. male presenting today at the request of Rakes, Linda M, FNP for LLQ abdominal pain and need for high risk colonoscopy givne family history of colon cancer.   Recently seen by family medicine reporting a 2-3 month history of LLQ abdominal pain described as pinching/burning. Aggravated by heavy lifting and pulling. Pain does not radiate and denied any alarm symptoms. Labs, urinalysis, and abdominal ultrasound ordered. Also placed referral for GI given family history of colon cancer in his father (diagnosed at age 42).    Labs 01/04/22: UA unremarkable. Normal CBC. Elevated glucose. Normal LFTs and renal function.   Abdominal ultrasound 01/19/22: No discrete hernia, mass, or other abnormality identified in the LLQ.   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 Still has a slightly burning sensation to his LLQ at times. Denis any changes in bowel habits, melena, BRBPR. No other abdominal pain, lack of appetite, early satiety, weight loss. Does report some prior fatigue. Denies any issues with dysphagia does have reflux when eating pizza and eating late at night. Stil gets a twinge of pain in his lower abdomen. Has been wearing a back brace and can not tell a difference in his abdominal pain but thinks he may been having more knee pain with wearing it. Does not take anything to help with pain in his abdomen. Does take alka seltzer cold medicine about one per day when he has sinus troubles.   Father passed in 2012.   Current Outpatient  Medications  Medication Sig Dispense Refill   aspirin-sod bicarb-citric acid (ALKA-SELTZER) 325 MG TBEF tablet Take 325 mg by mouth every 6 (six) hours as needed. Cold and flu     No current facility-administered medications for this visit.    Past Medical History:  Diagnosis Date   Asthma     No past surgical history on file.  Family History  Problem Relation Age of Onset   Cancer Father    Diabetes Father     Allergies as of 01/24/2022 - Review Complete 01/24/2022  Allergen Reaction Noted   Other Shortness Of Breath 01/04/2022    Social History   Socioeconomic History   Marital status: Single    Spouse name: Not on file   Number of children: Not on file   Years of education: Not on file   Highest education level: Not on file  Occupational History   Not on file  Tobacco Use   Smoking status: Never   Smokeless tobacco: Never  Vaping Use   Vaping Use: Never used  Substance and Sexual Activity   Alcohol use: No   Drug use: No   Sexual activity: Not on file  Other Topics Concern   Not on file  Social History Narrative   Not on file   Social Determinants of Health   Financial Resource Strain: Not on file  Food Insecurity: Not on file  Transportation Needs: Not on file  Physical   Activity: Not on file  Stress: Not on file  Social Connections: Not on file  Intimate Partner Violence: Not on file     Review of Systems   Gen: Denies any fever, chills, fatigue, weight loss, lack of appetite.  CV: Denies chest pain, heart palpitations, peripheral edema, syncope.  Resp: Denies shortness of breath at rest or with exertion. Denies wheezing or cough.  GI: see HPI GU : Denies urinary burning, urinary frequency, urinary hesitancy MS: Denies joint pain, muscle weakness, cramps, or limitation of movement.  Derm: Denies rash, itching, dry skin Psych: Denies depression, anxiety, memory loss, and confusion Heme: Denies bruising, bleeding, and enlarged lymph  nodes.   Physical Exam   BP 138/80 (BP Location: Right Arm, Patient Position: Sitting, Cuff Size: Large)   Pulse 75   Temp 97.7 F (36.5 C) (Temporal)   Ht 6' 2" (1.88 m)   Wt 244 lb 3.2 oz (110.8 kg)   SpO2 96%   BMI 31.35 kg/m   General:   Alert and oriented. Pleasant and cooperative. Well-nourished and well-developed.  Head:  Normocephalic and atraumatic. Eyes:  Without icterus, sclera clear and conjunctiva pink.  Ears:  Normal auditory acuity. Mouth:  No deformity or lesions, oral mucosa pink.  Lungs:  Clear to auscultation bilaterally. No wheezes, rales, or rhonchi. No distress.  Heart:  S1, S2 present without murmurs appreciated.  Abdomen:  +BS, soft, non-tender and non-distended. No HSM noted. No guarding or rebound. No masses appreciated.  Rectal:  Deferred  Msk:  Symmetrical without gross deformities. Normal posture. Extremities:  Without edema. Neurologic:  Alert and  oriented x4;  grossly normal neurologically. Skin:  Intact without significant lesions or rashes. Psych:  Alert and cooperative. Normal mood and affect.   Assessment   Andrew Contreras is a 41 y.o. male with a history of asthma as a child and seasonal allergies presenting today for evaluation of LLQ abdominal pain and need for TCS.   LLQ abdominal pain: Began 2-3 months ago, aggravated by heaving lifting and pulling.  Was worse when he was lifting at his prior job.  Now has a new job and does have to do some heavy lifting but not on a daily basis.  Pain usually only is present with lifting or pulling, usually stops once he stops his activities.  Prior abdominal ultrasound negative for any hernia, mass, or other abnormality.  Suspect this is MSK in nature given its occurrence with strenuous activity.  To be referred pain from his back or strain of his abdominal muscles.  He denies any constipation, diarrhea, or other abdominal pain.  Admits only to Alka-Seltzer cold medication that he uses for sinus issues  this time of year.  If pain were to worsen or he were to develop any change in bowel habits, we could consider additional abdominal imaging or consider imaging of his back.  He was encouraged to continue wearing his back brace.  We discussed prophylactic Tylenol use on days that he will be lifting heavier or frequently throughout the shift.  Family history of colon cancer: Father diagnosed at age 42 with CRC. He has never had a colonoscopy. Has been experiencing some LLQ abdominal pain with pulling and heavy lifting.   Denies any melena or BRBPR, nausea, vomiting, dysphagia, reflux, total weight loss, lack appetite, early satiety, or other upper abdominal pain.  Given his family history, recommend high risk screening colonoscopy. I have discussed the risks, alternatives, benefits with regards to but not limited   to the risk of reaction to medication, bleeding, infection, perforation and the patient is agreeable to proceed. Written consent to be obtained.  PLAN    Proceed with colonoscopy with propofol by Dr. Abbey Chatters in near future: ASA 2 Continue to wear back brace.  Avoid NSAIDs Tylenol as needed for pain Follow-up post procedure  Venetia Night, MSN, FNP-BC, AGACNP-BC [facility]

## 2022-01-23 NOTE — H&P (View-Only) (Signed)
GI Office Note    Referring Provider: Sonny Masters, FNP Primary Care Physician:  Mechele Claude, MD  Primary Gastroenterologist: Hennie Duos. Marletta Lor, DO   Chief Complaint   Chief Complaint  Patient presents with   Abdominal Pain    Lower left abdominal pains for about 3 to 4 months. Regular BM's       History of Present Illness   Andrew Contreras is a 41 y.o. male presenting today at the request of Rakes, Doralee Albino, FNP for LLQ abdominal pain and need for high risk colonoscopy givne family history of colon cancer.   Recently seen by family medicine reporting a 2-3 month history of LLQ abdominal pain described as pinching/burning. Aggravated by heavy lifting and pulling. Pain does not radiate and denied any alarm symptoms. Labs, urinalysis, and abdominal ultrasound ordered. Also placed referral for GI given family history of colon cancer in his father (diagnosed at age 84).    Labs 01/04/22: UA unremarkable. Normal CBC. Elevated glucose. Normal LFTs and renal function.   Abdominal ultrasound 01/19/22: No discrete hernia, mass, or other abnormality identified in the LLQ.   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 Still has a slightly burning sensation to his LLQ at times. Denis any changes in bowel habits, melena, BRBPR. No other abdominal pain, lack of appetite, early satiety, weight loss. Does report some prior fatigue. Denies any issues with dysphagia does have reflux when eating pizza and eating late at night. Stil gets a twinge of pain in his lower abdomen. Has been wearing a back brace and can not tell a difference in his abdominal pain but thinks he may been having more knee pain with wearing it. Does not take anything to help with pain in his abdomen. Does take alka seltzer cold medicine about one per day when he has sinus troubles.   Father passed in 2012.   Current Outpatient  Medications  Medication Sig Dispense Refill   aspirin-sod bicarb-citric acid (ALKA-SELTZER) 325 MG TBEF tablet Take 325 mg by mouth every 6 (six) hours as needed. Cold and flu     No current facility-administered medications for this visit.    Past Medical History:  Diagnosis Date   Asthma     No past surgical history on file.  Family History  Problem Relation Age of Onset   Cancer Father    Diabetes Father     Allergies as of 01/24/2022 - Review Complete 01/24/2022  Allergen Reaction Noted   Other Shortness Of Breath 01/04/2022    Social History   Socioeconomic History   Marital status: Single    Spouse name: Not on file   Number of children: Not on file   Years of education: Not on file   Highest education level: Not on file  Occupational History   Not on file  Tobacco Use   Smoking status: Never   Smokeless tobacco: Never  Vaping Use   Vaping Use: Never used  Substance and Sexual Activity   Alcohol use: No   Drug use: No   Sexual activity: Not on file  Other Topics Concern   Not on file  Social History Narrative   Not on file   Social Determinants of Health   Financial Resource Strain: Not on file  Food Insecurity: Not on file  Transportation Needs: Not on file  Physical  Activity: Not on file  Stress: Not on file  Social Connections: Not on file  Intimate Partner Violence: Not on file     Review of Systems   Gen: Denies any fever, chills, fatigue, weight loss, lack of appetite.  CV: Denies chest pain, heart palpitations, peripheral edema, syncope.  Resp: Denies shortness of breath at rest or with exertion. Denies wheezing or cough.  GI: see HPI GU : Denies urinary burning, urinary frequency, urinary hesitancy MS: Denies joint pain, muscle weakness, cramps, or limitation of movement.  Derm: Denies rash, itching, dry skin Psych: Denies depression, anxiety, memory loss, and confusion Heme: Denies bruising, bleeding, and enlarged lymph  nodes.   Physical Exam   BP 138/80 (BP Location: Right Arm, Patient Position: Sitting, Cuff Size: Large)   Pulse 75   Temp 97.7 F (36.5 C) (Temporal)   Ht 6\' 2"  (1.88 m)   Wt 244 lb 3.2 oz (110.8 kg)   SpO2 96%   BMI 31.35 kg/m   General:   Alert and oriented. Pleasant and cooperative. Well-nourished and well-developed.  Head:  Normocephalic and atraumatic. Eyes:  Without icterus, sclera clear and conjunctiva pink.  Ears:  Normal auditory acuity. Mouth:  No deformity or lesions, oral mucosa pink.  Lungs:  Clear to auscultation bilaterally. No wheezes, rales, or rhonchi. No distress.  Heart:  S1, S2 present without murmurs appreciated.  Abdomen:  +BS, soft, non-tender and non-distended. No HSM noted. No guarding or rebound. No masses appreciated.  Rectal:  Deferred  Msk:  Symmetrical without gross deformities. Normal posture. Extremities:  Without edema. Neurologic:  Alert and  oriented x4;  grossly normal neurologically. Skin:  Intact without significant lesions or rashes. Psych:  Alert and cooperative. Normal mood and affect.   Assessment   Andrew Contreras is a 41 y.o. male with a history of asthma as a child and seasonal allergies presenting today for evaluation of LLQ abdominal pain and need for TCS.   LLQ abdominal pain: Began 2-3 months ago, aggravated by heaving lifting and pulling.  Was worse when he was lifting at his prior job.  Now has a new job and does have to do some heavy lifting but not on a daily basis.  Pain usually only is present with lifting or pulling, usually stops once he stops his activities.  Prior abdominal ultrasound negative for any hernia, mass, or other abnormality.  Suspect this is MSK in nature given its occurrence with strenuous activity.  To be referred pain from his back or strain of his abdominal muscles.  He denies any constipation, diarrhea, or other abdominal pain.  Admits only to Alka-Seltzer cold medication that he uses for sinus issues  this time of year.  If pain were to worsen or he were to develop any change in bowel habits, we could consider additional abdominal imaging or consider imaging of his back.  He was encouraged to continue wearing his back brace.  We discussed prophylactic Tylenol use on days that he will be lifting heavier or frequently throughout the shift.  Family history of colon cancer: Father diagnosed at age 49 with CRC. He has never had a colonoscopy. Has been experiencing some LLQ abdominal pain with pulling and heavy lifting.   Denies any melena or BRBPR, nausea, vomiting, dysphagia, reflux, total weight loss, lack appetite, early satiety, or other upper abdominal pain.  Given his family history, recommend high risk screening colonoscopy. I have discussed the risks, alternatives, benefits with regards to but not limited  to the risk of reaction to medication, bleeding, infection, perforation and the patient is agreeable to proceed. Written consent to be obtained.  PLAN    Proceed with colonoscopy with propofol by Dr. Abbey Chatters in near future: ASA 2 Continue to wear back brace.  Avoid NSAIDs Tylenol as needed for pain Follow-up post procedure  Venetia Night, MSN, FNP-BC, AGACNP-BC [facility]

## 2022-01-24 ENCOUNTER — Encounter: Payer: Self-pay | Admitting: *Deleted

## 2022-01-24 ENCOUNTER — Encounter: Payer: Self-pay | Admitting: Gastroenterology

## 2022-01-24 ENCOUNTER — Ambulatory Visit (INDEPENDENT_AMBULATORY_CARE_PROVIDER_SITE_OTHER): Payer: Self-pay | Admitting: Gastroenterology

## 2022-01-24 VITALS — BP 138/80 | HR 75 | Temp 97.7°F | Ht 74.0 in | Wt 244.2 lb

## 2022-01-24 DIAGNOSIS — R1032 Left lower quadrant pain: Secondary | ICD-10-CM

## 2022-01-24 DIAGNOSIS — Z800 Family history of malignant neoplasm of digestive organs: Secondary | ICD-10-CM

## 2022-01-24 MED ORDER — PEG 3350-KCL-NA BICARB-NACL 420 G PO SOLR: 4000.0000 mL | Freq: Once | ORAL | 0 refills | Status: AC

## 2022-01-24 NOTE — Patient Instructions (Addendum)
We are scheduling you for colonoscopy in the near future with Dr. Abbey Chatters.  When she continue wearing her back brace.  If need be he may attempt to wear a abdominal binder to help with more support in the front if you are doing a lot of heavy lifting.  You may prophylactically take 500 or 1000 mg of Tylenol daily on days that you are doing a lot of heavy lifting.  Avoid NSAIDs (ibuprofen, Advil, Aleve, BC Goody powders, or other aspirin powders) as much as possible.  Follow-up as needed after procedures.  If you begin to have worsening lower quadrant abdominal pain and/or changes in bowel habits, please reach out.  We may be able to order a CT scan of your abdomen at that time.  It was a pleasure to see you today. I want to create trusting relationships with patients. If you receive a survey regarding your visit,  I greatly appreciate you taking time to fill this out on paper or through your MyChart. I value your feedback.  Venetia Night, MSN, FNP-BC, AGACNP-BC [facility]

## 2022-01-25 ENCOUNTER — Encounter: Payer: Self-pay | Admitting: *Deleted

## 2022-02-15 ENCOUNTER — Encounter (HOSPITAL_COMMUNITY)
Admission: RE | Admit: 2022-02-15 | Discharge: 2022-02-15 | Disposition: A | Discharge status: Home / Self Care | Payer: BLUE CROSS/BLUE SHIELD | Source: Ambulatory Visit | Attending: Internal Medicine | Admitting: Internal Medicine

## 2022-02-15 ENCOUNTER — Other Ambulatory Visit: Payer: Self-pay

## 2022-02-18 ENCOUNTER — Ambulatory Visit (HOSPITAL_COMMUNITY): Payer: BLUE CROSS/BLUE SHIELD | Admitting: Anesthesiology

## 2022-02-18 ENCOUNTER — Encounter (HOSPITAL_COMMUNITY)
Admission: RE | Disposition: A | Discharge status: Home / Self Care | Payer: Self-pay | Source: Ambulatory Visit | Attending: Internal Medicine

## 2022-02-18 ENCOUNTER — Ambulatory Visit (HOSPITAL_COMMUNITY)
Admission: RE | Admit: 2022-02-18 | Discharge: 2022-02-18 | Disposition: A | Discharge status: Home / Self Care | Payer: BLUE CROSS/BLUE SHIELD | Source: Ambulatory Visit | Attending: Internal Medicine | Admitting: Internal Medicine

## 2022-02-18 ENCOUNTER — Other Ambulatory Visit: Payer: Self-pay

## 2022-02-18 ENCOUNTER — Encounter (HOSPITAL_COMMUNITY): Payer: Self-pay

## 2022-02-18 DIAGNOSIS — Z1211 Encounter for screening for malignant neoplasm of colon: Secondary | ICD-10-CM | POA: Diagnosis present

## 2022-02-18 DIAGNOSIS — Z1212 Encounter for screening for malignant neoplasm of rectum: Secondary | ICD-10-CM

## 2022-02-18 DIAGNOSIS — Z800 Family history of malignant neoplasm of digestive organs: Secondary | ICD-10-CM | POA: Insufficient documentation

## 2022-02-18 DIAGNOSIS — R1032 Left lower quadrant pain: Secondary | ICD-10-CM | POA: Insufficient documentation

## 2022-02-18 HISTORY — PX: COLONOSCOPY WITH PROPOFOL: SHX5780

## 2022-02-18 SURGERY — COLONOSCOPY WITH PROPOFOL
Anesthesia: General

## 2022-02-18 MED ORDER — LIDOCAINE HCL (CARDIAC) PF 100 MG/5ML IV SOSY
PREFILLED_SYRINGE | INTRAVENOUS | Status: DC | PRN
  Administered 2022-02-18: 50 mg via INTRAVENOUS

## 2022-02-18 MED ORDER — LACTATED RINGERS IV SOLN: INTRAVENOUS | Status: DC

## 2022-02-18 MED ORDER — PROPOFOL 10 MG/ML IV BOLUS
INTRAVENOUS | Status: DC | PRN
  Administered 2022-02-18: 50 mg via INTRAVENOUS
  Administered 2022-02-18: 100 mg via INTRAVENOUS
  Administered 2022-02-18 (×4): 50 mg via INTRAVENOUS
  Administered 2022-02-18: 100 mg via INTRAVENOUS

## 2022-02-18 MED ORDER — STERILE WATER FOR IRRIGATION IR SOLN
Status: DC | PRN
  Administered 2022-02-18: 60 mL

## 2022-02-18 NOTE — Discharge Instructions (Signed)
  Colonoscopy Discharge Instructions  Read the instructions outlined below and refer to this sheet in the next few weeks. These discharge instructions provide you with general information on caring for yourself after you leave the hospital. Your doctor may also give you specific instructions. While your treatment has been planned according to the most current medical practices available, unavoidable complications occasionally occur.   ACTIVITY You may resume your regular activity, but move at a slower pace for the next 24 hours.  Take frequent rest periods for the next 24 hours.  Walking will help get rid of the air and reduce the bloated feeling in your belly (abdomen).  No driving for 24 hours (because of the medicine (anesthesia) used during the test).   Do not sign any important legal documents or operate any machinery for 24 hours (because of the anesthesia used during the test).  NUTRITION Drink plenty of fluids.  You may resume your normal diet as instructed by your doctor.  Begin with a light meal and progress to your normal diet. Heavy or fried foods are harder to digest and may make you feel sick to your stomach (nauseated).  Avoid alcoholic beverages for 24 hours or as instructed.  MEDICATIONS You may resume your normal medications unless your doctor tells you otherwise.  WHAT YOU CAN EXPECT TODAY Some feelings of bloating in the abdomen.  Passage of more gas than usual.  Spotting of blood in your stool or on the toilet paper.  IF YOU HAD POLYPS REMOVED DURING THE COLONOSCOPY: No aspirin products for 7 days or as instructed.  No alcohol for 7 days or as instructed.  Eat a soft diet for the next 24 hours.  FINDING OUT THE RESULTS OF YOUR TEST Not all test results are available during your visit. If your test results are not back during the visit, make an appointment with your caregiver to find out the results. Do not assume everything is normal if you have not heard from your  caregiver or the medical facility. It is important for you to follow up on all of your test results.  SEEK IMMEDIATE MEDICAL ATTENTION IF: You have more than a spotting of blood in your stool.  Your belly is swollen (abdominal distention).  You are nauseated or vomiting.  You have a temperature over 101.  You have abdominal pain or discomfort that is severe or gets worse throughout the day.   Your colonoscopy was relatively unremarkable.  I did not find any polyps or evidence of colon cancer.  I recommend repeating colonoscopy in 5 years for colon cancer screening purposes given your family history. Follow-up with GI as needed.   I hope you have a great rest of your week!  Mckennon Zwart K. Don Tiu, D.O. Gastroenterology and Hepatology [facility]  

## 2022-02-18 NOTE — Anesthesia Preprocedure Evaluation (Addendum)
Anesthesia Evaluation  Patient identified by MRN, date of birth, ID band Patient awake    Reviewed: Allergy & Precautions, H&P , NPO status , Patient's Chart, lab work & pertinent test results  Airway Mallampati: II  TM Distance: >3 FB Neck ROM: Full    Dental  (+) Dental Advisory Given, Teeth Intact   Pulmonary asthma    Pulmonary exam normal breath sounds clear to auscultation       Cardiovascular negative cardio ROS Normal cardiovascular exam Rhythm:Regular Rate:Normal     Neuro/Psych negative neurological ROS  negative psych ROS   GI/Hepatic negative GI ROS, Neg liver ROS,,,  Endo/Other  negative endocrine ROS    Renal/GU negative Renal ROS  negative genitourinary   Musculoskeletal negative musculoskeletal ROS (+)    Abdominal   Peds negative pediatric ROS (+)  Hematology negative hematology ROS (+)   Anesthesia Other Findings   Reproductive/Obstetrics negative OB ROS                             Anesthesia Physical Anesthesia Plan  ASA: 2  Anesthesia Plan: General   Post-op Pain Management: Minimal or no pain anticipated   Induction:   PONV Risk Score and Plan: Propofol infusion  Airway Management Planned: Nasal Cannula and Natural Airway  Additional Equipment:   Intra-op Plan:   Post-operative Plan:   Informed Consent: I have reviewed the patients History and Physical, chart, labs and discussed the procedure including the risks, benefits and alternatives for the proposed anesthesia with the patient or authorized representative who has indicated his/her understanding and acceptance.     Dental advisory given  Plan Discussed with: CRNA and Surgeon  Anesthesia Plan Comments:        Anesthesia Quick Evaluation

## 2022-02-18 NOTE — Transfer of Care (Signed)
Immediate Anesthesia Transfer of Care Note  Patient: Andrew Contreras  Procedure(s) Performed: COLONOSCOPY WITH PROPOFOL  Patient Location: Short Stay  Anesthesia Type:General  Level of Consciousness: awake and alert   Airway & Oxygen Therapy: Patient Spontanous Breathing  Post-op Assessment: Report given to RN and Post -op Vital signs reviewed and stable  Post vital signs: Reviewed and stable  Last Vitals:  Vitals Value Taken Time  BP 88/58 02/18/22 1110  Temp 36.8 C 02/18/22 1110  Pulse 83 02/18/22 1110  Resp 18 02/18/22 1110  SpO2 95 % 02/18/22 1110    Last Pain:  Vitals:   02/18/22 1110  TempSrc: Oral  PainSc: 0-No pain      Patients Stated Pain Goal: 5 (02/18/22 1024)  Complications: No notable events documented.

## 2022-02-18 NOTE — Interval H&P Note (Signed)
History and Physical Interval Note:  02/18/2022 10:20 AM  Andrew Contreras  has presented today for surgery, with the diagnosis of FH colon cancer.  The various methods of treatment have been discussed with the patient and family. After consideration of risks, benefits and other options for treatment, the patient has consented to  Procedure(s) with comments: COLONOSCOPY WITH PROPOFOL (N/A) - 11:15am, asa 3 as a surgical intervention.  The patient's history has been reviewed, patient examined, no change in status, stable for surgery.  I have reviewed the patient's chart and labs.  Questions were answered to the patient's satisfaction.     Lanelle Bal

## 2022-02-18 NOTE — Op Note (Signed)
[facility] Patient Name: Andrew Contreras Procedure Date: 02/18/2022 10:38 AM MRN: [id number] Date of Birth: [birth year] Attending MD: Elon Alas. Edgar Frisk, [id number] CSN: [id number] Age: 41 Admit Type: Outpatient Procedure:                Colonoscopy Indications:              Screening in patient at increased risk: Colorectal                            cancer in mother before age 73 Providers:                Elon Alas. Abbey Chatters, DO, Caprice Kluver, Casimer Bilis, Technician Referring MD:              Medicines:                See the Anesthesia note for documentation of the                            administered medications Complications:            No immediate complications. Estimated Blood Loss:     Estimated blood loss: none. Procedure:                Pre-Anesthesia Assessment:                           - The anesthesia plan was to use monitored                            anesthesia care (MAC).                           After obtaining informed consent, the colonoscope                            was passed under direct vision. Throughout the                            procedure, the patient's blood pressure, pulse, and                            oxygen saturations were monitored continuously. The                            PCF-HQ190L ([id number]) scope was introduced through                            the anus and advanced to the the cecum, identified                            by appendiceal orifice and ileocecal valve. The                            colonoscopy was performed without  difficulty. The                            patient tolerated the procedure well. The quality                            of the bowel preparation was evaluated using the                            BBPS [facility] Bowel Preparation Scale) with scores                            of: Right Colon = 3, Transverse Colon = 3 and Left                            Colon = 3 (entire  mucosa seen well with no residual                            staining, small fragments of stool or opaque                            liquid). The total BBPS score equals 9. Scope In: 10:48:25 AM Scope Out: 11:06:27 AM Scope Withdrawal Time: 0 hours 13 minutes 46 seconds  Total Procedure Duration: 0 hours 18 minutes 2 seconds  Findings:      The perianal and digital rectal examinations were normal.      The entire examined colon appeared normal on direct and retroflexion       views. Impression:               - The entire examined colon is normal on direct and                            retroflexion views.                           - No specimens collected. Moderate Sedation:      Per Anesthesia Care Recommendation:           - Patient has a contact number available for                            emergencies. The signs and symptoms of potential                            delayed complications were discussed with the                            patient. Return to normal activities tomorrow.                            Written discharge instructions were provided to the                            patient.                           -  Resume previous diet.                           - Continue present medications.                           - Repeat colonoscopy in 5 years for screening                            purposes/family history of colon cancer in father.                           - Return to GI clinic PRN. Procedure Code(s):        --- Professional ---                           J8841, Colorectal cancer screening; colonoscopy on                            individual at high risk Diagnosis Code(s):        --- Professional ---                           Z80.0, Family history of malignant neoplasm of                            digestive organs CPT copyright 2022 [employer]. All rights reserved. The codes documented in this report are preliminary and upon coder review may   be revised to meet current compliance requirements. Elon Alas. Abbey Chatters, DO Merrill Jewelz Kobus, DO 02/18/2022 11:08:46 AM This report has been signed electronically. Number of Addenda: 0

## 2022-02-18 NOTE — Anesthesia Postprocedure Evaluation (Signed)
Anesthesia Post Note  Patient: Andrew Contreras  Procedure(s) Performed: COLONOSCOPY WITH PROPOFOL  Patient location during evaluation: Phase II Anesthesia Type: General Level of consciousness: awake and alert and oriented Pain management: pain level controlled Vital Signs Assessment: post-procedure vital signs reviewed and stable Respiratory status: spontaneous breathing, nonlabored ventilation and respiratory function stable Cardiovascular status: blood pressure returned to baseline and stable Postop Assessment: no apparent nausea or vomiting Anesthetic complications: no  No notable events documented.   Last Vitals:  Vitals:   02/18/22 1110 02/18/22 1121  BP: (!) 88/58 (!) 142/80  Pulse: 83   Resp: 18   Temp: 36.8 C   SpO2: 95%     Last Pain:  Vitals:   02/18/22 1121  TempSrc:   PainSc: 0-No pain                 [name]

## 2022-02-25 ENCOUNTER — Encounter (HOSPITAL_COMMUNITY): Payer: Self-pay | Admitting: Internal Medicine

## 2022-09-29 ENCOUNTER — Ambulatory Visit: Payer: BLUE CROSS/BLUE SHIELD | Admitting: Nurse Practitioner

## 2022-09-29 ENCOUNTER — Encounter: Payer: Self-pay | Admitting: Nurse Practitioner

## 2022-09-29 VITALS — BP 119/67 | HR 68 | Temp 98.1°F | Ht 74.0 in | Wt 239.6 lb

## 2022-09-29 DIAGNOSIS — W450XXA Nail entering through skin, initial encounter: Secondary | ICD-10-CM | POA: Diagnosis not present

## 2022-09-29 DIAGNOSIS — Z23 Encounter for immunization: Secondary | ICD-10-CM | POA: Insufficient documentation

## 2022-09-29 DIAGNOSIS — S91331A Puncture wound without foreign body, right foot, initial encounter: Secondary | ICD-10-CM

## 2022-09-29 NOTE — Addendum Note (Signed)
Addended by: Daisy Blossom on: 09/29/2022 11:13 AM   Modules accepted: Orders

## 2022-09-29 NOTE — Progress Notes (Signed)
   Acute Office Visit  Subjective:     Patient ID: Andrew Contreras, male    DOB: Sep 23, 1980, 42 y.o.   MRN: [id number]  Chief Complaint  Patient presents with   stepped on nail    Stepped on nail yesterday. Sore, red, swollen. Put antibitoic ointment on it     HPI Step on Nail while  working, works at [employer]. Denies fever, myalgia. 3/10 pain that is relieve with ibuprofen. He I snot up to date on tetanus ROS Negative unless indicated in HPI    Objective:    BP 119/67   Pulse 68   Temp 98.1 F (36.7 C) (Temporal)   Ht 6\' 2"  (1.88 m)   Wt 239 lb 9.6 oz (108.7 kg)   SpO2 95%   BMI 30.76 kg/m  BP Readings from Last 3 Encounters:  09/29/22 119/67  02/18/22 (!) 142/80  01/24/22 138/80   Wt Readings from Last 3 Encounters:  09/29/22 239 lb 9.6 oz (108.7 kg)  02/18/22 238 lb (108 kg)  02/14/22 244 lb 4.3 oz (110.8 kg)      Physical Exam Constitutional:      Appearance: Normal appearance. He is overweight.  HENT:     Head: Normocephalic and atraumatic.  Cardiovascular:     Rate and Rhythm: Normal rate.     Heart sounds: Normal heart sounds.  Pulmonary:     Effort: Pulmonary effort is normal.     Breath sounds: Normal breath sounds.  Musculoskeletal:        General: Normal range of motion.     Cervical back: Normal range of motion and neck supple. No rigidity or tenderness.     Right lower leg: No edema.     Left lower leg: No edema.     Right foot: Tenderness present. No bony tenderness.     Left foot: Normal.  Skin:    General: Skin is warm and dry.     Findings: No rash.  Neurological:     General: No focal deficit present.     Mental Status: He is alert and oriented to person, place, and time. Mental status is at baseline.  Psychiatric:        Mood and Affect: Mood normal.        Behavior: Behavior normal.        Thought Content: Thought content normal.        Judgment: Judgment normal.     No results found for any visits on  09/29/22.      Assessment & Plan:  Nail entering through skin, initial encounter  Need for vaccination for DTaP  Puncture wound DTap administered Ibuprofen for pain Work note/limitations provided  Return if symptoms worsen or fail to improve.  [address] Santa Lighter DNP
